# Patient Record
Sex: Female | Born: 1945 | Race: Black or African American | Hispanic: No | Smoking: Current every day smoker
Health system: Southern US, Community
[De-identification: ages and names within clinical notes are randomized; demographics above are authoritative.]

## PROBLEM LIST (undated history)

## (undated) DIAGNOSIS — E785 Hyperlipidemia, unspecified: Secondary | ICD-10-CM

## (undated) DIAGNOSIS — I10 Essential (primary) hypertension: Secondary | ICD-10-CM

## (undated) HISTORY — DX: Hyperlipidemia, unspecified: E78.5

## (undated) HISTORY — PX: BREAST SURGERY: SHX581

---

## 1997-12-26 ENCOUNTER — Emergency Department (HOSPITAL_COMMUNITY): Admission: EM | Admit: 1997-12-26 | Discharge: 1997-12-26 | Payer: Self-pay | Admitting: Internal Medicine

## 1999-08-15 ENCOUNTER — Inpatient Hospital Stay (HOSPITAL_COMMUNITY): Admission: EM | Admit: 1999-08-15 | Discharge: 1999-08-16 | Payer: Self-pay | Admitting: Emergency Medicine

## 1999-08-17 ENCOUNTER — Encounter: Payer: Self-pay | Admitting: *Deleted

## 1999-08-17 ENCOUNTER — Emergency Department (HOSPITAL_COMMUNITY): Admission: EM | Admit: 1999-08-17 | Discharge: 1999-08-17 | Payer: Self-pay | Admitting: *Deleted

## 2003-03-15 ENCOUNTER — Emergency Department (HOSPITAL_COMMUNITY): Admission: EM | Admit: 2003-03-15 | Discharge: 2003-03-15 | Payer: Self-pay | Admitting: Emergency Medicine

## 2003-11-27 ENCOUNTER — Emergency Department (HOSPITAL_COMMUNITY): Admission: EM | Admit: 2003-11-27 | Discharge: 2003-11-27 | Payer: Self-pay | Admitting: Emergency Medicine

## 2003-11-28 ENCOUNTER — Emergency Department (HOSPITAL_COMMUNITY): Admission: EM | Admit: 2003-11-28 | Discharge: 2003-11-28 | Payer: Self-pay | Admitting: Family Medicine

## 2003-12-04 ENCOUNTER — Encounter: Admission: RE | Admit: 2003-12-04 | Discharge: 2003-12-04 | Payer: Self-pay | Admitting: Family Medicine

## 2003-12-10 ENCOUNTER — Emergency Department (HOSPITAL_COMMUNITY): Admission: EM | Admit: 2003-12-10 | Discharge: 2003-12-10 | Payer: Self-pay | Admitting: Emergency Medicine

## 2004-04-29 ENCOUNTER — Emergency Department (HOSPITAL_COMMUNITY): Admission: EM | Admit: 2004-04-29 | Discharge: 2004-04-29 | Payer: Self-pay | Admitting: *Deleted

## 2004-05-11 ENCOUNTER — Emergency Department (HOSPITAL_COMMUNITY): Admission: EM | Admit: 2004-05-11 | Discharge: 2004-05-11 | Payer: Self-pay | Admitting: *Deleted

## 2004-07-08 ENCOUNTER — Emergency Department (HOSPITAL_COMMUNITY): Admission: EM | Admit: 2004-07-08 | Discharge: 2004-07-08 | Payer: Self-pay | Admitting: Emergency Medicine

## 2005-07-26 ENCOUNTER — Emergency Department (HOSPITAL_COMMUNITY): Admission: EM | Admit: 2005-07-26 | Discharge: 2005-07-26 | Payer: Self-pay | Admitting: Emergency Medicine

## 2006-01-20 IMAGING — CR DG LUMBAR SPINE COMPLETE 4+V
5 series · 5 of 5 positions shown · non-contrast
Comparison: none

CLINICAL DATA: Low back pain. 
 COMPLETE LUMBAR SPINE, 12/10/03

[view not recorded (1 of 5)]
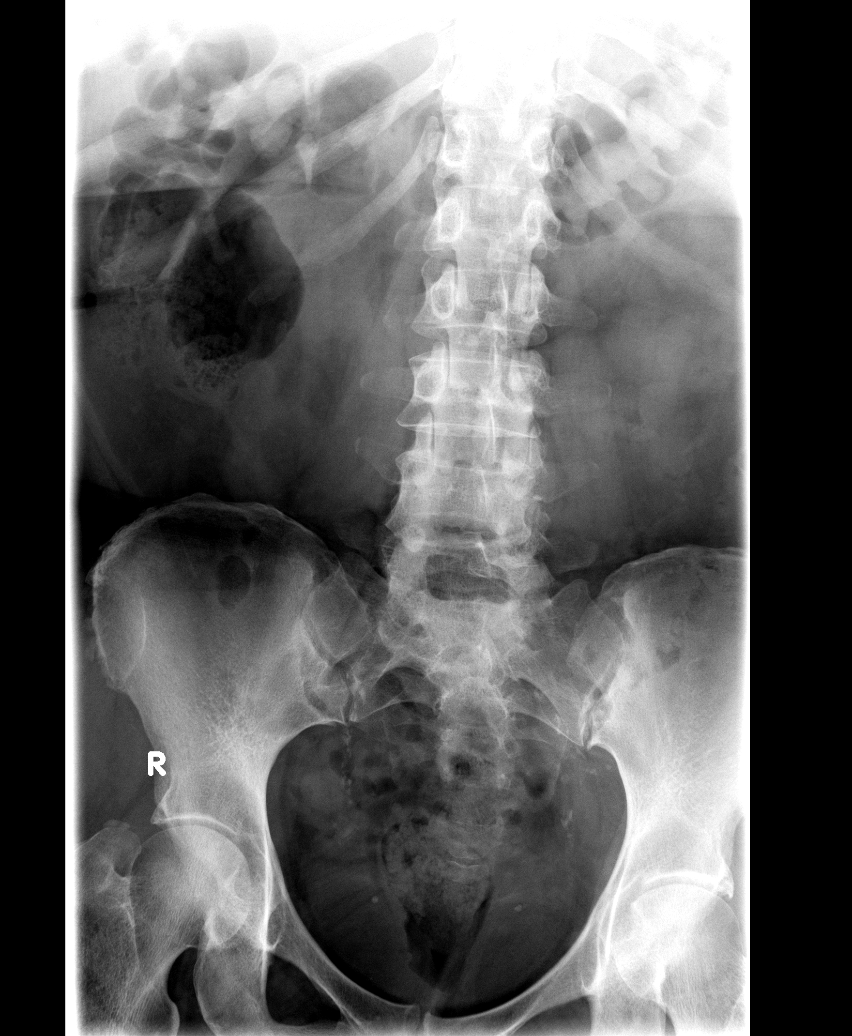

[view not recorded (2 of 5)]
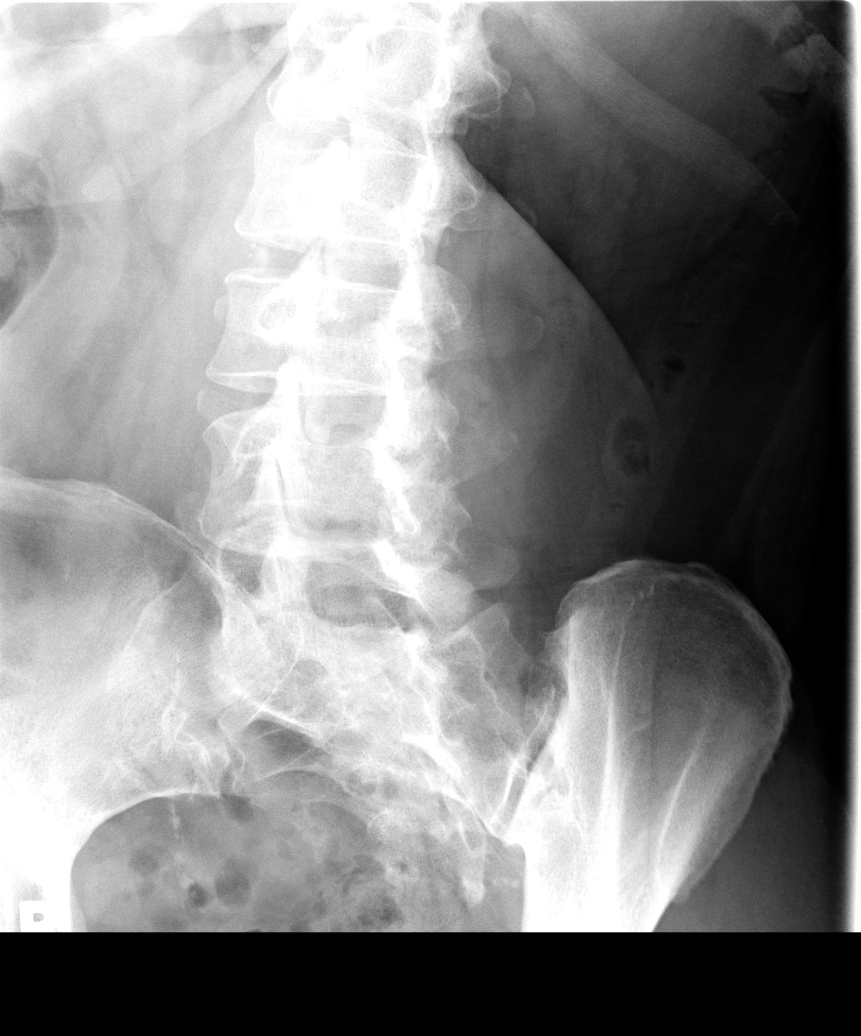

[view not recorded (3 of 5)]
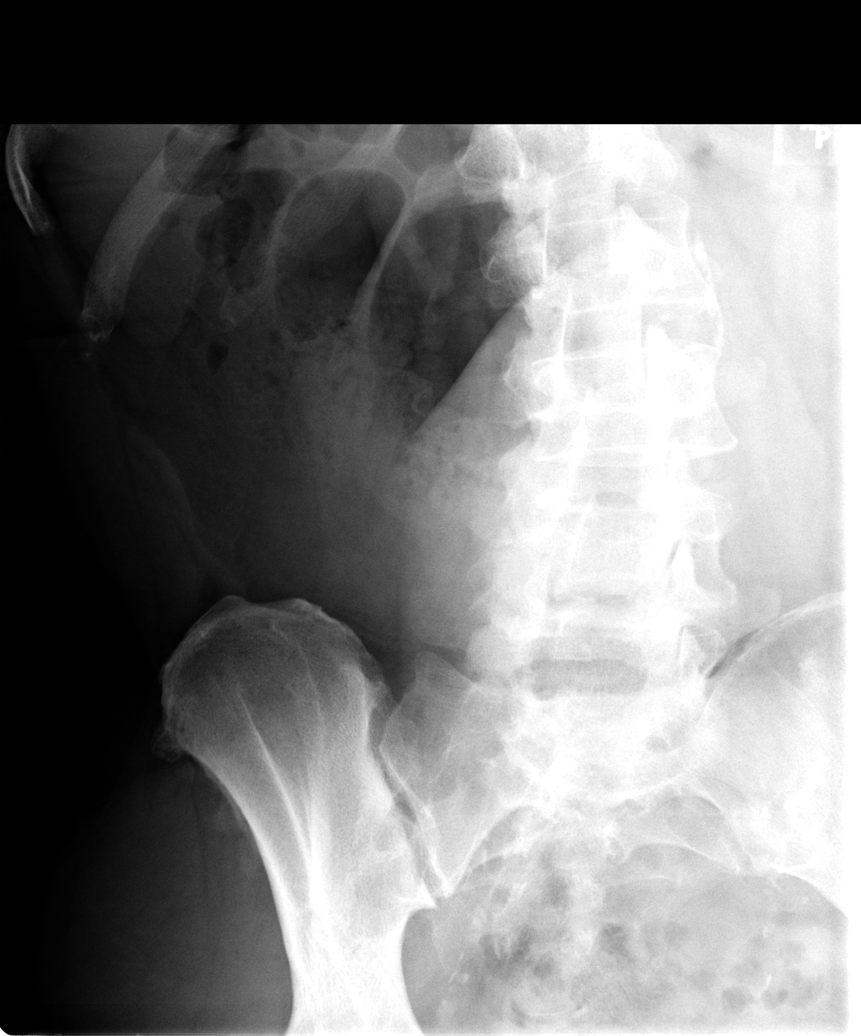

[view not recorded (4 of 5)]
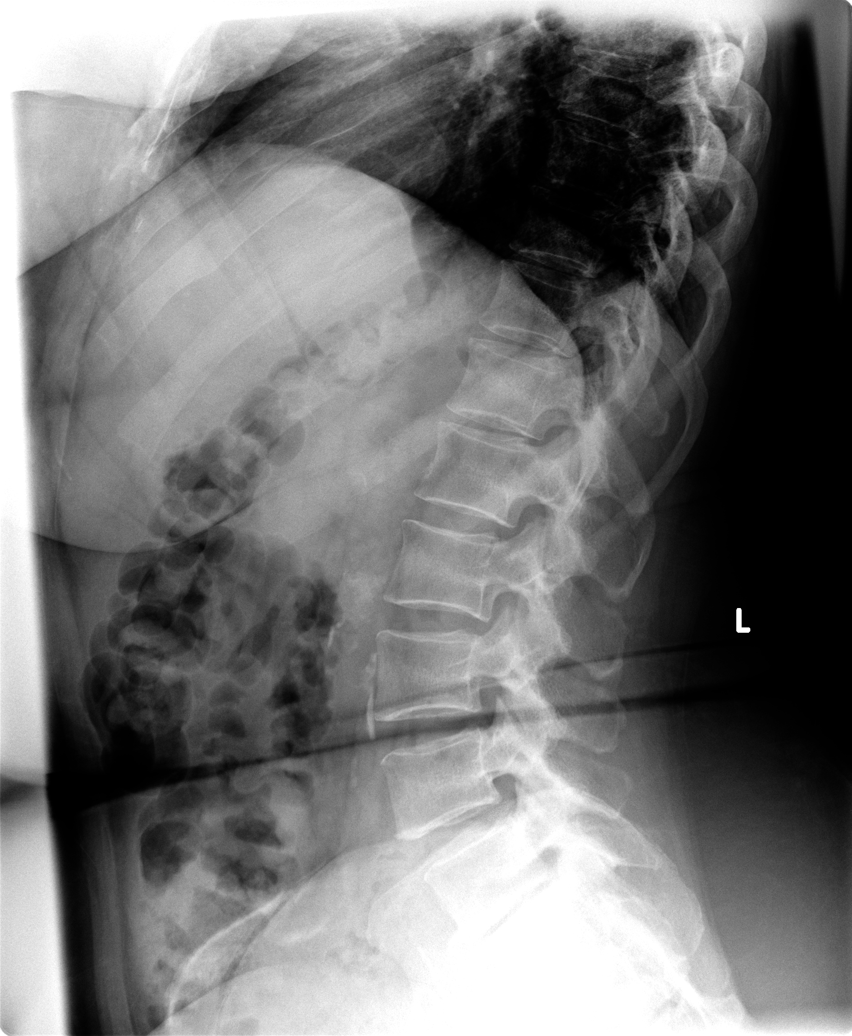

[view not recorded (5 of 5)]
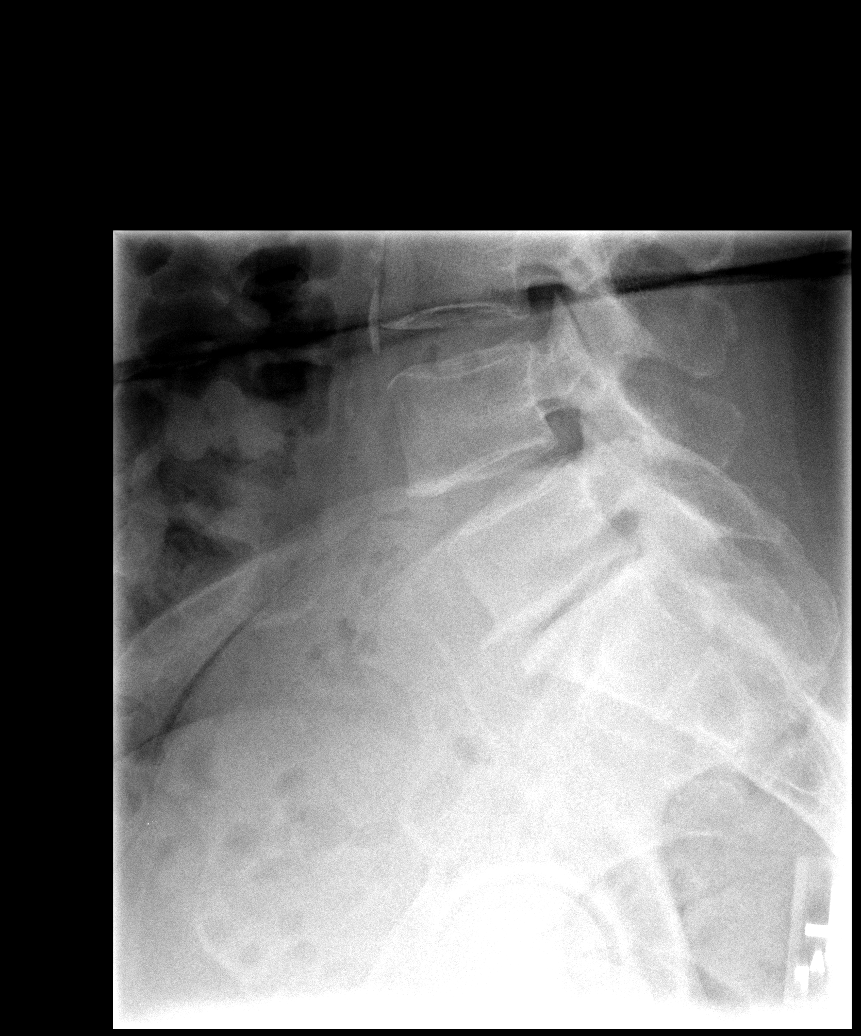

[5 of 5 positions shown; findings below may reference images not displayed]

FINDINGS: Five non-rib-bearing lumbar type vertebrae demonstrate normal alignment.   There is no evidence of fracture, subluxation or dislocation.  Mild degenerative disc disease throughout the lumbar spine noted with moderate degenerative disc disease and spondylosis L5-S1.  Mild facet arthropathy in the lower lumbar spine also noted.  
 IMPRESSION
 No acute abnormality.  
 Degenerative changes as described, moderate at L5-S1.

## 2006-01-20 IMAGING — CR DG CERVICAL SPINE COMPLETE 4+V
5 series · 5 of 5 positions shown · non-contrast
Comparison: none

CLINICAL DATA: 57-year-old with left arm numbness and weakness.  Neck pain.
 CERVICAL SPINE COMPLETE:
 No prior exams are available for comparison.
 AP, lateral, odontoid and oblique views are performed of the cervical spine showing no evidence for acute fracture or dislocation.  No significant foraminal narrowing or degenerative change is identified.  
 IMPRESSION
 No evidence for acute abnormality of the cervical spine.

[view not recorded (1 of 5)]
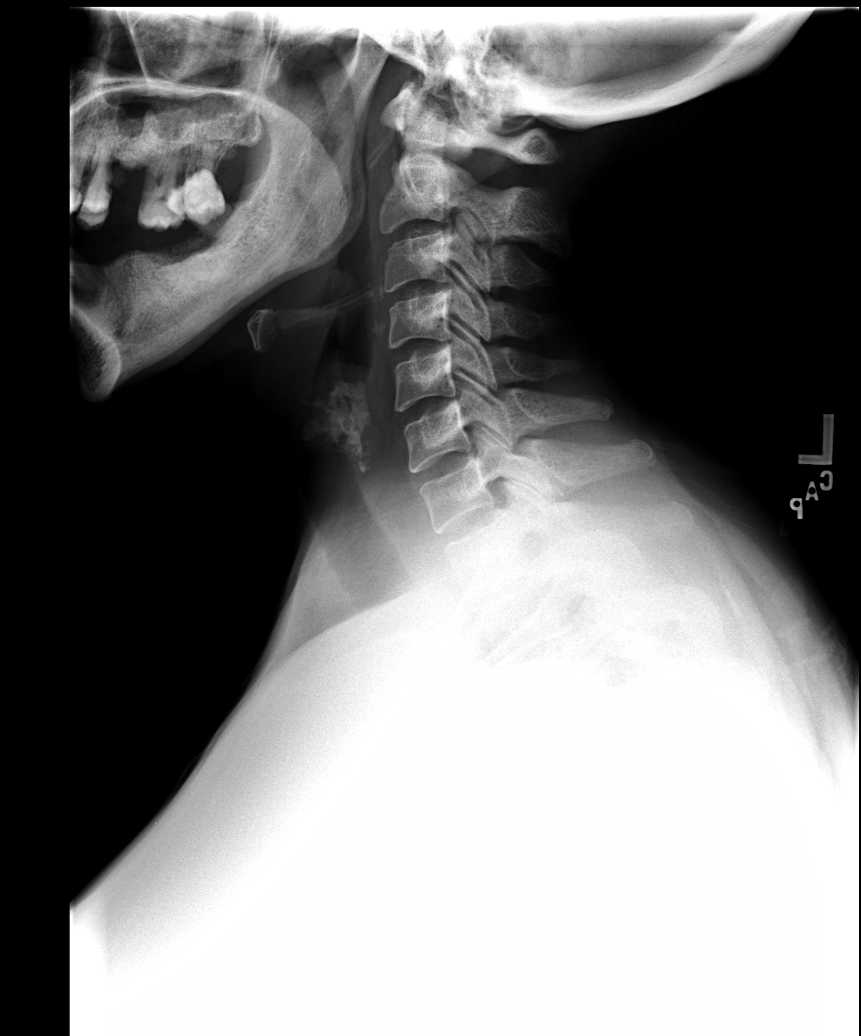

[view not recorded (2 of 5)]
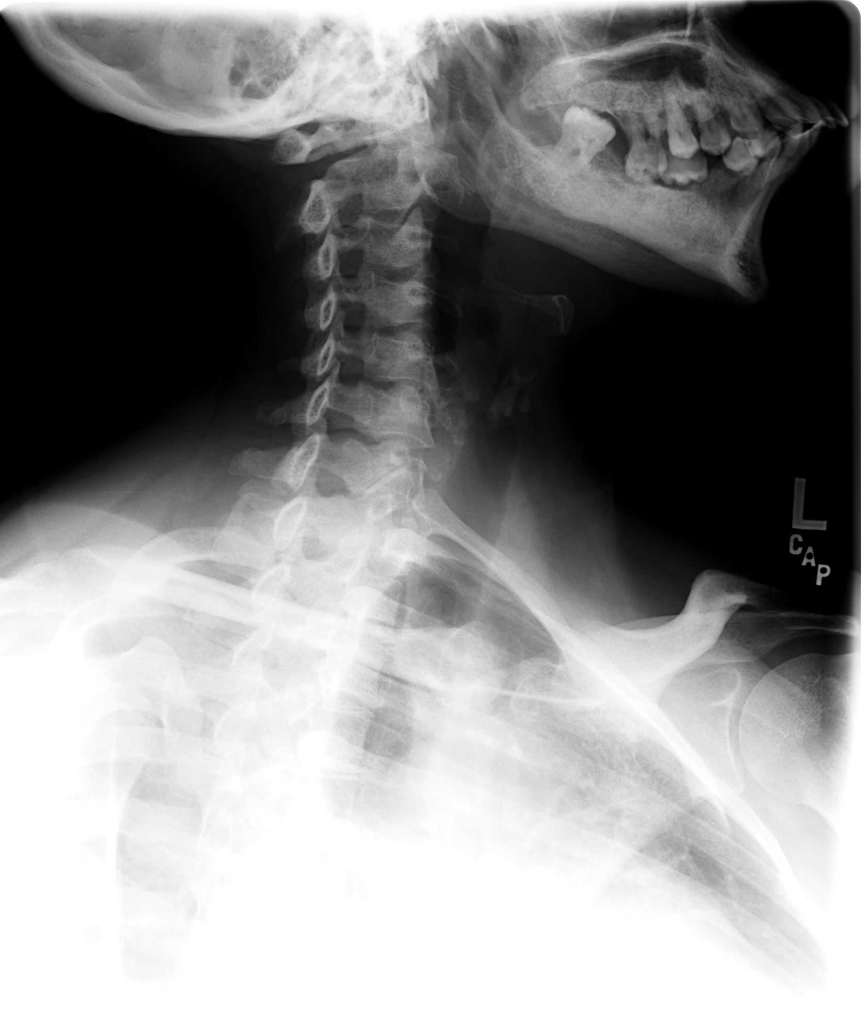

[view not recorded (3 of 5)]
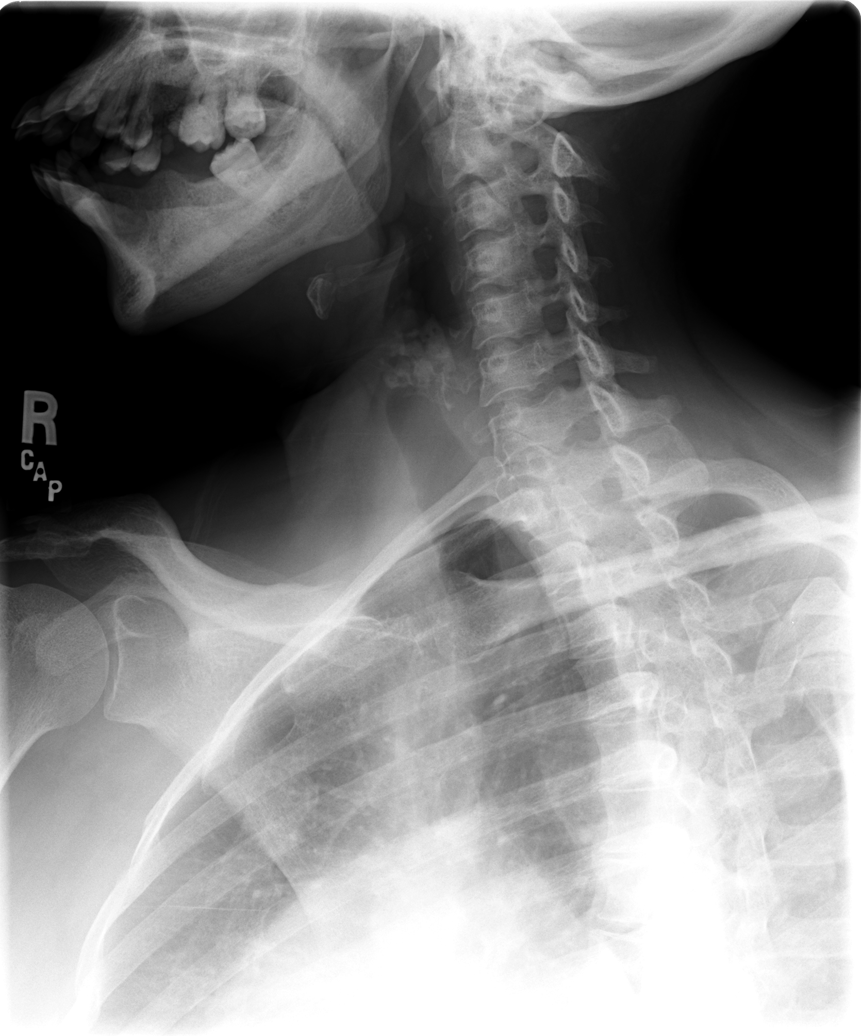

[view not recorded (4 of 5)]
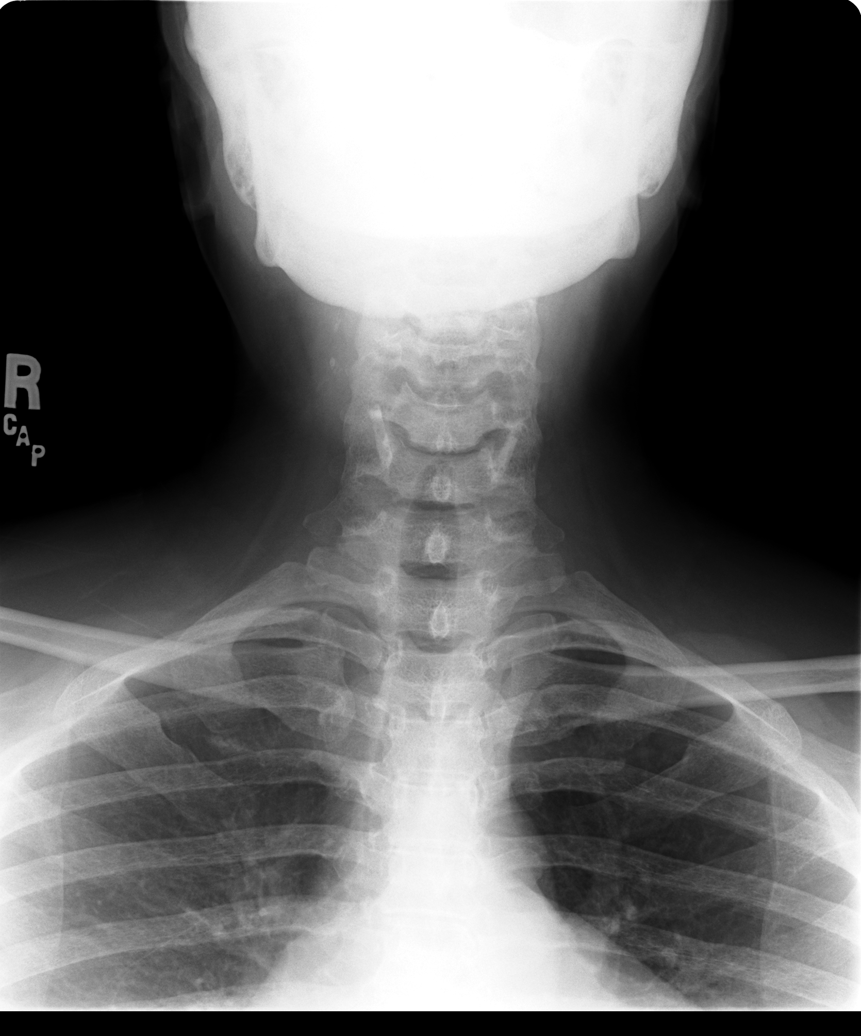

[view not recorded (5 of 5)]
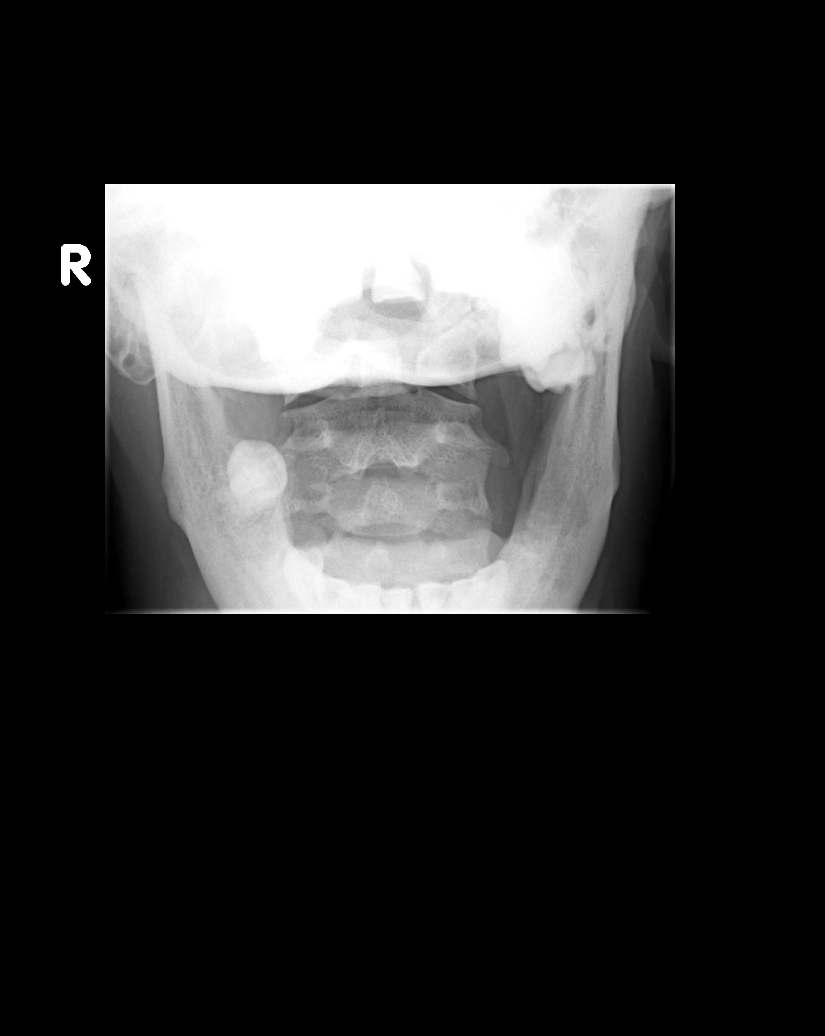

[5 of 5 positions shown; findings below may reference images not displayed]

## 2006-02-16 ENCOUNTER — Emergency Department (HOSPITAL_COMMUNITY): Admission: EM | Admit: 2006-02-16 | Discharge: 2006-02-16 | Payer: Self-pay | Admitting: Emergency Medicine

## 2009-01-16 ENCOUNTER — Emergency Department (HOSPITAL_COMMUNITY): Admission: EM | Admit: 2009-01-16 | Discharge: 2009-01-16 | Payer: Self-pay | Admitting: Emergency Medicine

## 2009-01-22 ENCOUNTER — Encounter: Admission: RE | Admit: 2009-01-22 | Discharge: 2009-02-06 | Payer: Self-pay | Admitting: Family Medicine

## 2010-08-23 NOTE — Discharge Summary (Signed)
[place]  Patient:    Andrea Meyer, Andrea Meyer                      MRN: [id number] Adm. Date:  [id number] Disc. Date: [id number] Attending:  Carmelina Peal Dictator:   Tawni Millers CC:         Charlynne Pander. Bruna Potter, M.D.                           Discharge Summary  DATE OF BIRTH:  [birth year]  DISCHARGE DIAGNOSES: 1. Anaphylactic reaction causing cardiopulmonary arrest. 2. History of "hyperventilation syndrome" with heart palpitations, especially    with consumption of caffeine. 3. Hypertension, treated x 27 years. 4. History of shoulder bursitis.  DISCHARGE MEDICATIONS: 1. Calan 180 mg p.o. q.d. 2. Maxzide 25 mg p.o. q.d. 3. The patient was discharged with an EpiPen.  HISTORY OF PRESENT ILLNESS:  This 65 year old black female who was out fishing was stung by a bee, became short of breath and dizzy, then went down.  Upon EMS arrival she was pulseless and apneic.  CPR was performed x 10 minutes, and an epinephrine injection was given.  She awoke in the hospital.  PAST MEDICAL HISTORY: 1. History of "bee sting allergy."  The patient says she gets local swelling    from bee stings.  She has no history of this anaphylactic reaction. 2. Hypertension for 27 years. 3. Occasional heart fluttering.  Cardiac workup by Dr. Clarene Duke was negative,    unknown time on this workup.  PHYSICAL EXAMINATION:  When the patient was first found her systolic blood pressure was in the 80s, diastolic in the 40s.  On arrival to the ED, her blood pressure was 90-110/40-60 with a pulse in the 90s, respirations 18, temperature 96.2.  The only significant exam finding was that she is tender throughout her chest secondary to chest compressions.  Her tongue was not swollen, nor was there any local edema or sign of bee sting on her arms. Initial CK and troponin were negative.  HOSPITAL COURSE: #1 - ANAPHYLACTIC REACTION WITH CARDIOPULMONARY COLLAPSE:  The patient remained  stable in the hospital with no further episodes.  She did have nothing except a few palpitations, which the patient gets at home as well. Blood pressure remained in the low 100s-110s, and there was no shortness of breath.  After she was observed for almost 24 hours, she was sent home with an epinephrine pen and continued on her regular medicine.  She is to follow up with me to see how she is doing posthospitalization.  #2 - HYPERTENSION:  Have continued her Calan and Maxzide.  This will be followed up.  #3 - MILD HYPERGLYCEMIA ON ADMISSION:  The patients glucose was 167 and 190 on blood test.  This may be due to the steroids she got in the emergency room. This needs to be followed up as an outpatient.  #4 - OTHER STUDIES:  The patient had a chest x-ray which was normal.  She also had cardiac enzymes which were 362 and 5.5 CK-MB and 426 and 6.1.  This is status post cardiac compressions and is to be expected.  White cell count of 6.1, hemoglobin 15, platelets 285, with MCV of 90.7.  She did have a potassium of 2.5 which came up to 3.9 after 40 mEq p.o. when it was checked the next day.  She also had a total bilirubin  of 0.3, alkaline phosphatase 56, AST of 70, ALT of 62, total protein 6.2, albumin 3.0, and calcium 8.3.  She had a UA which was cloudy, with 30 protein and large white blood cells, but had many cl and 21-50 white blood cells.  FOLLOW-UP:  The patient was instructed to follow up with Dr. Tawni Millers in the hospital clinic on Aug 25, 2064. DD:  08/16/99 TD:  08/19/99 Job: [id number]

## 2012-06-02 ENCOUNTER — Emergency Department (HOSPITAL_COMMUNITY)
Admission: EM | Admit: 2012-06-02 | Discharge: 2012-06-02 | Disposition: A | Discharge status: Home / Self Care | Payer: Medicare Other | Attending: Emergency Medicine | Admitting: Emergency Medicine

## 2012-06-02 ENCOUNTER — Encounter (HOSPITAL_COMMUNITY): Payer: Self-pay | Admitting: *Deleted

## 2012-06-02 ENCOUNTER — Emergency Department (HOSPITAL_COMMUNITY): Payer: Medicare Other

## 2012-06-02 DIAGNOSIS — I10 Essential (primary) hypertension: Secondary | ICD-10-CM | POA: Insufficient documentation

## 2012-06-02 DIAGNOSIS — Z760 Encounter for issue of repeat prescription: Secondary | ICD-10-CM | POA: Insufficient documentation

## 2012-06-02 DIAGNOSIS — R42 Dizziness and giddiness: Secondary | ICD-10-CM | POA: Insufficient documentation

## 2012-06-02 DIAGNOSIS — R531 Weakness: Secondary | ICD-10-CM

## 2012-06-02 DIAGNOSIS — J40 Bronchitis, not specified as acute or chronic: Secondary | ICD-10-CM | POA: Insufficient documentation

## 2012-06-02 DIAGNOSIS — F17200 Nicotine dependence, unspecified, uncomplicated: Secondary | ICD-10-CM | POA: Insufficient documentation

## 2012-06-02 DIAGNOSIS — E785 Hyperlipidemia, unspecified: Secondary | ICD-10-CM | POA: Insufficient documentation

## 2012-06-02 DIAGNOSIS — E669 Obesity, unspecified: Secondary | ICD-10-CM | POA: Insufficient documentation

## 2012-06-02 DIAGNOSIS — R5381 Other malaise: Secondary | ICD-10-CM | POA: Insufficient documentation

## 2012-06-02 DIAGNOSIS — Z79899 Other long term (current) drug therapy: Secondary | ICD-10-CM | POA: Insufficient documentation

## 2012-06-02 HISTORY — DX: Essential (primary) hypertension: I10

## 2012-06-02 LAB — COMPREHENSIVE METABOLIC PANEL
ALT: 11 U/L (ref 0–35)
AST: 17 U/L (ref 0–37)
Albumin: 3.8 g/dL (ref 3.5–5.2)
Alkaline Phosphatase: 91 U/L (ref 39–117)
BUN: 15 mg/dL (ref 6–23)
CO2: 25 mEq/L (ref 19–32)
Calcium: 10.2 mg/dL (ref 8.4–10.5)
Chloride: 99 mEq/L (ref 96–112)
Creatinine, Ser: 0.91 mg/dL (ref 0.50–1.10)
GFR calc Af Amer: 74 mL/min — ABNORMAL LOW (ref >90)
GFR calc non Af Amer: 64 mL/min — ABNORMAL LOW (ref >90)
Glucose, Bld: 125 mg/dL — ABNORMAL HIGH (ref 70–99)
Potassium: 3.2 mEq/L — ABNORMAL LOW (ref 3.5–5.1)
Sodium: 137 mEq/L (ref 135–145)
Total Bilirubin: 0.2 mg/dL — ABNORMAL LOW (ref 0.3–1.2)
Total Protein: 8.6 g/dL — ABNORMAL HIGH (ref 6.0–8.3)

## 2012-06-02 LAB — LIPASE, BLOOD: Lipase: 46 U/L (ref 11–59)

## 2012-06-02 LAB — CBC
HCT: 41.3 % (ref 36.0–46.0)
Hemoglobin: 13.8 g/dL (ref 12.0–15.0)
MCH: 28.3 pg (ref 26.0–34.0)
MCHC: 33.4 g/dL (ref 30.0–36.0)
MCV: 84.6 fL (ref 78.0–100.0)
Platelets: 240 10*3/uL (ref 150–400)
RBC: 4.88 MIL/uL (ref 3.87–5.11)
RDW: 15.7 % — ABNORMAL HIGH (ref 11.5–15.5)
WBC: 8.2 10*3/uL (ref 4.0–10.5)

## 2012-06-02 LAB — POCT I-STAT TROPONIN I: Troponin i, poc: 0 ng/mL (ref 0.00–0.08)

## 2012-06-02 MED ORDER — POTASSIUM CHLORIDE CRYS ER 20 MEQ PO TBCR
40.0000 meq | EXTENDED_RELEASE_TABLET | Freq: Once | ORAL | Status: AC
  Administered 2012-06-02: 40 meq via ORAL
  Filled 2012-06-02: qty 2

## 2012-06-02 MED ORDER — ATENOLOL 100 MG PO TABS: 100.0000 mg | ORAL_TABLET | Freq: Every day | ORAL | Status: DC

## 2012-06-02 MED ORDER — SODIUM CHLORIDE 0.9 % IV BOLUS (SEPSIS)
500.0000 mL | Freq: Once | INTRAVENOUS | Status: AC
  Administered 2012-06-02: 500 mL via INTRAVENOUS

## 2012-06-02 MED ORDER — AMOXICILLIN-POT CLAVULANATE 875-125 MG PO TABS: 1.0000 | ORAL_TABLET | Freq: Two times a day (BID) | ORAL | Status: DC

## 2012-06-02 NOTE — ED Notes (Addendum)
Pt with burning to central chest and "racing heart" since this am.  Denies cardiac hx, but states there "was a time when she had to take nitro pills under my tongue.  Pt states "fluttering" stops when she is still.

## 2012-06-02 NOTE — ED Provider Notes (Signed)
History     CSN: [id number]  Arrival date & time 06/02/12  1519   First MD Initiated Contact with Patient 06/02/12 2014      Chief Complaint  Patient presents with  . Chest Pain  . Palpitations    (Consider location/radiation/quality/duration/timing/severity/associated sxs/prior treatment) HPI Comments: Norie A Patchen is a 67 y.o. female with a history of hypertension and hyperlipidemia presents emergency department with multiple complaints.  Patient reports that she had an onset of cough that began 3 days ago that is productive in nature but denies any shortness of breath, chest pain, fevers, night sweats or chills associated with it.  In addition yesterday the patient woke up feeling lightheaded, dizzy, and weak.  She reports that she went to shower and was so exhausted she had to finish early and was unable to attend work.  Today he went patient awoke dizziness and generalized weakness had resolved and patient went to work as per usual.  While at work patient drank a diet soda which "typically gives me a racing heart" which she developed after drinking the soda.  Patient states the itching since of heart fluttering has also resolved but decided to come to the emergency department to be checked out.  In addition patient reports that she is out of her atenolol and requests a prescription refill.  Patient denies any leg pain, leg swelling, change in vision, ataxia, disequilibrium, unilateral weakness, LOC, facial droop, slurred speech, diaphoresis, nausea, CP.   The history is provided by the patient.    Past Medical History  Diagnosis Date  . Hypertension     Past Surgical History  Procedure Laterality Date  . Breast surgery      tumor removal    No family history on file.  History  Substance Use Topics  . Smoking status: Current Every Day Smoker -- 0.25 packs/day    Types: Cigarettes  . Smokeless tobacco: Not on file  . Alcohol Use: No    OB History   Grav Para Term  Preterm Abortions TAB SAB Ect Mult Living                  Review of Systems  All other systems reviewed and are negative.    Allergies  Review of patient's allergies indicates no known allergies.  Home Medications   Current Outpatient Rx  Name  Route  Sig  Dispense  Refill  . atenolol (TENORMIN) 100 MG tablet   Oral   Take 100 mg by mouth daily.         . hydrochlorothiazide (HYDRODIURIL) 25 MG tablet   Oral   Take 25 mg by mouth daily.         . potassium chloride SA (K-DUR,KLOR-CON) 20 MEQ tablet   Oral   Take 20 mEq by mouth daily.         . pravastatin (PRAVACHOL) 20 MG tablet   Oral   Take 20 mg by mouth daily.           BP 163/70  Pulse 112  Temp(Src) 97.9 F (36.6 C) (Oral)  Resp 18  Ht 5\' 6"  (1.676 m)  Wt 208 lb (94.348 kg)  BMI 33.59 kg/m2  SpO2 97%  Physical Exam  Nursing note and vitals reviewed. Constitutional: She is oriented to person, place, and time. She appears well-developed and well-nourished. No distress.  Patient very pleasant & not in visible distress  HENT:  Head: Normocephalic and atraumatic.  Eyes: Conjunctivae and EOM are  normal. Pupils are equal, round, and reactive to light.  Neck: Normal range of motion. Neck supple. Normal carotid pulses, no hepatojugular reflux and no JVD present. Carotid bruit is not present.  No carotid bruits, no JVD  Cardiovascular:  RRR, no aberrancy on auscultation, intact distal pulses no pitting edema bilaterally.  Pulmonary/Chest: Effort normal and breath sounds normal. No respiratory distress.  Lungs clear to auscultation bilaterally  Abdominal: Soft. She exhibits no distension. There is no tenderness.  Obese abdomen, normal bowel sounds. Nonpulsatile aorta  Musculoskeletal: Normal range of motion. She exhibits no tenderness.  Neurological: She is alert and oriented to person, place, and time. She displays a negative Romberg sign.  Cranial nerves III through XII intact, normal  coordination, negative Romberg, strength 5/5 bilaterally. No ataxia  Skin: Skin is warm and dry. No pallor.  Psychiatric: She has a normal mood and affect. Her behavior is normal.    ED Course  Procedures (including critical care time)  Labs Reviewed  COMPREHENSIVE METABOLIC PANEL - Abnormal; Notable for the following:    Potassium 3.2 (*)    Glucose, Bld 125 (*)    Total Protein 8.6 (*)    Total Bilirubin 0.2 (*)    GFR calc non Af Amer 64 (*)    GFR calc Af Amer 74 (*)    All other components within normal limits  CBC - Abnormal; Notable for the following:    RDW 15.7 (*)    All other components within normal limits  LIPASE, BLOOD  POCT I-STAT TROPONIN I  POCT I-STAT TROPONIN I   Dg Chest 2 View  06/02/2012  *RADIOLOGY REPORT*  Clinical Data: Chest pain, palpitations  CHEST - 2 VIEW  Comparison: Chest x-ray of 01/16/2009  Findings: No active infiltrate or effusion is seen.  Mild peribronchial thickening is noted which may indicate bronchitis. Mediastinal contours are normal.  The heart is within upper limits of normal.  No acute skeletal abnormality is seen.  IMPRESSION: No pneumonia.  Peribronchial thickening may indicate bronchitis.   Original Report Authenticated By: Dwyane Dee, M.D.     Date: 06/02/2012  Rate: 107  Rhythm: sinus tachycardia  QRS Axis: normal  Intervals: normal  ST/T Wave abnormalities: normal  Conduction Disutrbances:none  Narrative Interpretation:   Old EKG Reviewed: unchanged    No diagnosis found.  No orthostatic hypotension. Pt ambulates through out ED without difficulty BP 139/88  Pulse 87  Temp(Src) 97.9 F (36.6 C) (Oral)  Resp 13  Ht 5\' 6"  (1.676 m)  Wt 208 lb (94.348 kg)  BMI 33.59 kg/m2  SpO2 96%    MDM  Patient is a 67 year old female with a history of hypertension presents emergency department with multiple complaints including productive cough x3 days, medication refill for atenolol, and intermittent palpitations and  lightheadedness that have since resolved.  Patient states that she's never been evaluated by a cardiologist which will be referred to her at discharge.  Throughout hospital stay ppatient has been asymptomatic, in no acute distress with normal vital signs.  Chest x-ray with peribronchial thickening and likely diagnosis of bronchitis.  Discussed the antibiotics are not indicated  However pt states that she has had increased sputum production and requests Rx. Pt also denies any CP and has had 2 neg troponin's, and a mild tachycardia seen on ECG which was otherwise normal.   Pt has been advised start a PPI and return to the ED if worsening s/s develop including CP that becomes exertional, associated with diaphoresis  or nausea, radiates to left jaw/arm, worsens or becomes concerning in any way. Pt appears reliable for follow up and is agreeable to discharge.   Case has been discussed with and seen by Dr. Juleen China who agrees with the above plan to discharge.          Jaci Carrel, [place] 06/02/12 2307

## 2012-06-03 NOTE — ED Provider Notes (Signed)
Medical screening examination/treatment/procedure(s) were conducted as a shared visit with non-physician practitioner(s) and myself.  I personally evaluated the patient during the encounter  66yf with palpitations. Denies CP or SOB to me. On exam NAD. Well appearing. Lungs clear. No increased WOB. Heart reg and I could not appreciate a murmur. Abdomen benign. Skin warm and dry. Lower extremities symmetric as compared to each other. No calf tenderness. Negative Homan's. No palpable cords. W/u fairly unremarkable. Low suspicion for emergent etiology. Outpt FU.   Raeford Razor, MD 06/03/12 [phone]

## 2012-06-08 ENCOUNTER — Other Ambulatory Visit: Payer: Self-pay | Admitting: Family Medicine

## 2012-06-08 DIAGNOSIS — Z1231 Encounter for screening mammogram for malignant neoplasm of breast: Secondary | ICD-10-CM

## 2012-06-25 ENCOUNTER — Institutional Professional Consult (permissible substitution): Payer: Medicare Other | Admitting: Internal Medicine

## 2012-07-02 ENCOUNTER — Institutional Professional Consult (permissible substitution): Payer: Medicare Other | Admitting: Cardiovascular Disease

## 2012-07-06 ENCOUNTER — Encounter: Payer: Self-pay | Admitting: *Deleted

## 2012-07-08 ENCOUNTER — Institutional Professional Consult (permissible substitution): Payer: Medicare Other | Admitting: Cardiovascular Disease

## 2012-07-12 ENCOUNTER — Ambulatory Visit: Payer: Medicare Other

## 2012-07-29 ENCOUNTER — Encounter: Payer: Medicare Other | Admitting: Gastroenterology

## 2014-04-10 DIAGNOSIS — J42 Unspecified chronic bronchitis: Secondary | ICD-10-CM | POA: Diagnosis not present

## 2014-07-14 IMAGING — CR DG CHEST 2V
2 series · 2 of 2 positions shown · non-contrast
Comparison: Chest x-ray of 01/16/2009

CLINICAL DATA: Chest pain, palpitations

CHEST - 2 VIEW

[w chest pa]
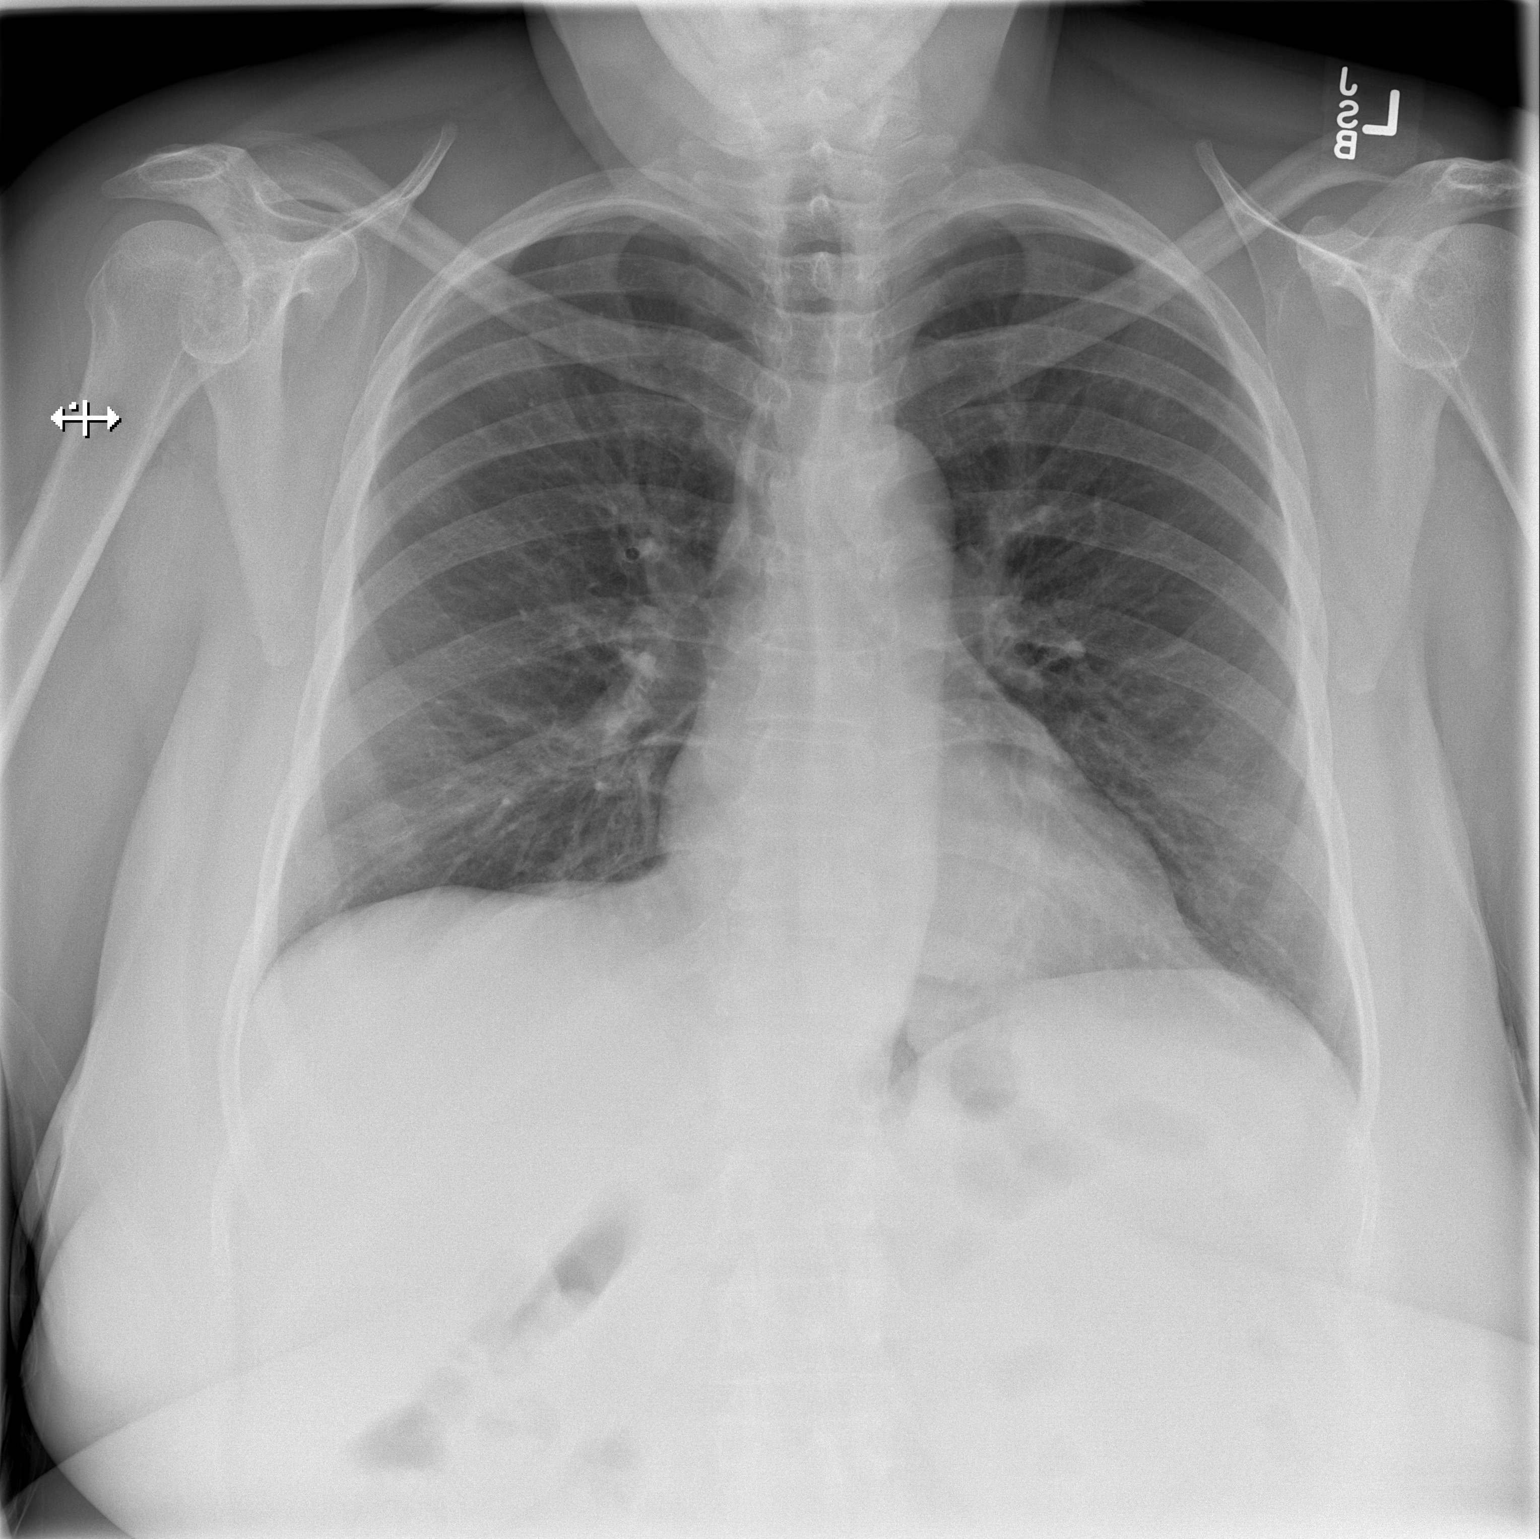

[w chest lat]
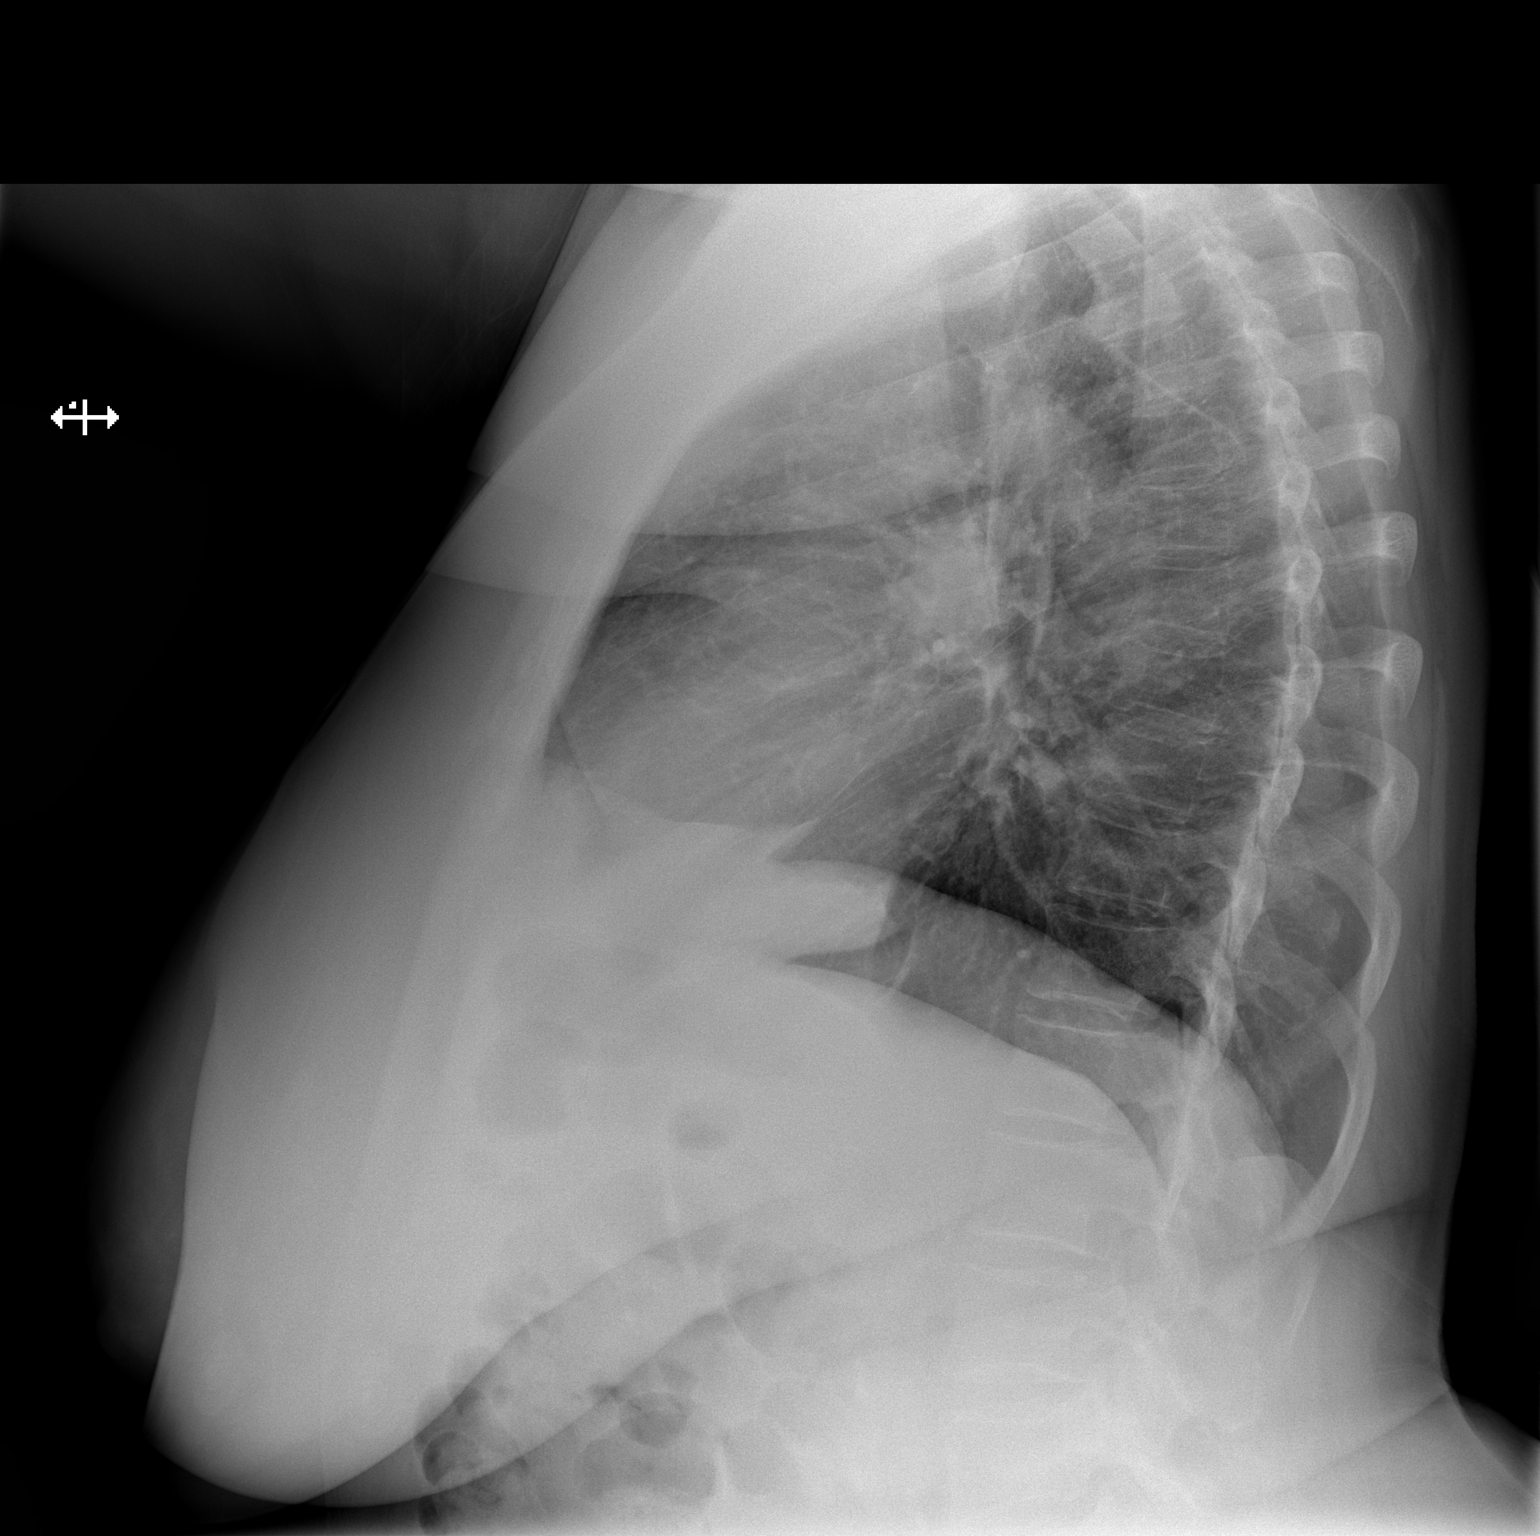

[2 of 2 positions shown; findings below may reference images not displayed]

FINDINGS: No active infiltrate or effusion is seen.  Mild
peribronchial thickening is noted which may indicate bronchitis.
Mediastinal contours are normal.  The heart is within upper limits
of normal.  No acute skeletal abnormality is seen.
IMPRESSION: No pneumonia.  Peribronchial thickening may indicate bronchitis.

## 2014-07-20 DIAGNOSIS — I10 Essential (primary) hypertension: Secondary | ICD-10-CM | POA: Diagnosis not present

## 2014-10-16 DIAGNOSIS — E785 Hyperlipidemia, unspecified: Secondary | ICD-10-CM | POA: Diagnosis not present

## 2014-12-12 DIAGNOSIS — J40 Bronchitis, not specified as acute or chronic: Secondary | ICD-10-CM | POA: Diagnosis not present

## 2015-05-16 DIAGNOSIS — M19012 Primary osteoarthritis, left shoulder: Secondary | ICD-10-CM | POA: Diagnosis not present

## 2015-08-22 DIAGNOSIS — H04123 Dry eye syndrome of bilateral lacrimal glands: Secondary | ICD-10-CM | POA: Diagnosis not present

## 2015-08-22 DIAGNOSIS — H2513 Age-related nuclear cataract, bilateral: Secondary | ICD-10-CM | POA: Diagnosis not present

## 2015-08-22 DIAGNOSIS — H40013 Open angle with borderline findings, low risk, bilateral: Secondary | ICD-10-CM | POA: Diagnosis not present

## 2015-09-10 DIAGNOSIS — H2513 Age-related nuclear cataract, bilateral: Secondary | ICD-10-CM | POA: Diagnosis not present

## 2015-09-10 DIAGNOSIS — H40013 Open angle with borderline findings, low risk, bilateral: Secondary | ICD-10-CM | POA: Diagnosis not present

## 2015-10-01 DIAGNOSIS — M159 Polyosteoarthritis, unspecified: Secondary | ICD-10-CM | POA: Diagnosis not present

## 2015-10-01 DIAGNOSIS — I119 Hypertensive heart disease without heart failure: Secondary | ICD-10-CM | POA: Diagnosis not present

## 2015-10-01 DIAGNOSIS — E7800 Pure hypercholesterolemia, unspecified: Secondary | ICD-10-CM | POA: Diagnosis not present

## 2015-10-01 DIAGNOSIS — Z79899 Other long term (current) drug therapy: Secondary | ICD-10-CM | POA: Diagnosis not present

## 2016-01-10 ENCOUNTER — Emergency Department (HOSPITAL_COMMUNITY)
Admission: EM | Admit: 2016-01-10 | Discharge: 2016-01-10 | Disposition: A | Discharge status: Home / Self Care | Payer: Medicare Other | Attending: Emergency Medicine | Admitting: Emergency Medicine

## 2016-01-10 ENCOUNTER — Encounter (HOSPITAL_COMMUNITY): Payer: Self-pay

## 2016-01-10 DIAGNOSIS — I10 Essential (primary) hypertension: Secondary | ICD-10-CM | POA: Diagnosis not present

## 2016-01-10 DIAGNOSIS — F17210 Nicotine dependence, cigarettes, uncomplicated: Secondary | ICD-10-CM | POA: Insufficient documentation

## 2016-01-10 DIAGNOSIS — R002 Palpitations: Secondary | ICD-10-CM | POA: Diagnosis not present

## 2016-01-10 LAB — BASIC METABOLIC PANEL
ANION GAP: 9 (ref 5–15)
BUN: 20 mg/dL (ref 6–20)
CHLORIDE: 101 mmol/L (ref 101–111)
CO2: 26 mmol/L (ref 22–32)
CREATININE: 1.12 mg/dL — AB (ref 0.44–1.00)
Calcium: 10.1 mg/dL (ref 8.9–10.3)
GFR calc non Af Amer: 49 mL/min — ABNORMAL LOW (ref >60)
GFR, EST AFRICAN AMERICAN: 56 mL/min — AB (ref >60)
Glucose, Bld: 103 mg/dL — ABNORMAL HIGH (ref 65–99)
Potassium: 4 mmol/L (ref 3.5–5.1)
SODIUM: 136 mmol/L (ref 135–145)

## 2016-01-10 LAB — CBC WITH DIFFERENTIAL/PLATELET
BASOS PCT: 0 %
Basophils Absolute: 0 10*3/uL (ref 0.0–0.1)
EOS ABS: 0.1 10*3/uL (ref 0.0–0.7)
Eosinophils Relative: 1 %
HEMATOCRIT: 43.4 % (ref 36.0–46.0)
HEMOGLOBIN: 13.7 g/dL (ref 12.0–15.0)
LYMPHS ABS: 3.3 10*3/uL (ref 0.7–4.0)
Lymphocytes Relative: 35 %
MCH: 27.7 pg (ref 26.0–34.0)
MCHC: 31.6 g/dL (ref 30.0–36.0)
MCV: 87.9 fL (ref 78.0–100.0)
MONOS PCT: 11 %
Monocytes Absolute: 1 10*3/uL (ref 0.1–1.0)
NEUTROS ABS: 5 10*3/uL (ref 1.7–7.7)
NEUTROS PCT: 53 %
Platelets: 246 10*3/uL (ref 150–400)
RBC: 4.94 MIL/uL (ref 3.87–5.11)
RDW: 15.5 % (ref 11.5–15.5)
WBC: 9.4 10*3/uL (ref 4.0–10.5)

## 2016-01-10 LAB — I-STAT TROPONIN, ED: TROPONIN I, POC: 0.02 ng/mL (ref 0.00–0.08)

## 2016-01-10 NOTE — ED Notes (Signed)
Pt is in stable condition upon d/c and is escorted from ED via wheelchair. 

## 2016-01-10 NOTE — ED Provider Notes (Signed)
[facility]-EMERGENCY DEPT Provider Note   CSN: [id number] Arrival date & time: 01/10/16  [id number]     History   Chief Complaint No chief complaint on file.   HPI Andrea Meyer is a 70 y.o. female.  HPI  70 year old female with a history of hypertension, hyperlipidemia, and hypokalemia presents with palpitations. She states that last night a little after 10 PM she had palpitations lasting about 50 minutes. She did not have any associated chest pain or shortness of breath. It seemed to wake her up out of a dream. She had similar palpitations 2 weeks ago immediately after being changed from atenolol to Lopressor by her PCP due to medication supply issues. She is concerned the medication is causing this. She states several years ago she had similar problems and was found to have hypokalemia. She took a potassium pill last night after waking up with the palpitations. Took her blood pressure medicines this morning. Around for the morning before going to work she felt an "uneasiness" in her chest. Didn't really feel like pain or pressure. She rubbed vapor rub on her chest and then it went away. There was no associated shortness of breath, dizziness, palpitations, nausea, vomiting. Currently she feels fine. Has not been ill recently.  Past Medical History:  Diagnosis Date  . Hyperlipidemia   . Hypertension     There are no active problems to display for this patient.   Past Surgical History:  Procedure Laterality Date  . BREAST SURGERY     tumor removal    OB History    No data available       Home Medications    Prior to Admission medications   Medication Sig Start Date End Date Taking? Authorizing Provider  amoxicillin-clavulanate (AUGMENTIN) 875-125 MG per tablet Take 1 tablet by mouth 2 (two) times daily. One po bid x 7 days 06/02/12   Lisette Paz, PA-C  atenolol (TENORMIN) 100 MG tablet Take 1 tablet (100 mg total) by mouth daily. 06/02/12   Lisette Paz, PA-C  hydrochlorothiazide  (HYDRODIURIL) 25 MG tablet Take 25 mg by mouth daily.    Historical Provider, MD  potassium chloride SA (K-DUR,KLOR-CON) 20 MEQ tablet Take 20 mEq by mouth daily.    Historical Provider, MD  pravastatin (PRAVACHOL) 20 MG tablet Take 20 mg by mouth daily.    Historical Provider, MD    Family History History reviewed. No pertinent family history.  Social History Social History  Substance Use Topics  . Smoking status: Current Every Day Smoker    Packs/day: 0.25    Types: Cigarettes  . Smokeless tobacco: Never Used  . Alcohol use No     Allergies   Review of patient's allergies indicates no known allergies.   Review of Systems Review of Systems  Constitutional: Negative for fever.  Respiratory: Negative for shortness of breath.   Cardiovascular: Positive for palpitations. Negative for chest pain.  Gastrointestinal: Negative for diarrhea and vomiting.  Neurological: Negative for dizziness.  All other systems reviewed and are negative.    Physical Exam Updated Vital Signs BP 152/69   Pulse 77   Temp 98 F (36.7 C) (Oral)   Resp 18   Ht 5\' 6"  (1.676 m)   Wt 220 lb (99.8 kg)   SpO2 99%   BMI 35.51 kg/m   Physical Exam  Constitutional: She is oriented to person, place, and time. She appears well-developed and well-nourished. No distress.  HENT:  Head: Normocephalic and atraumatic.  Right Ear: External  ear normal.  Left Ear: External ear normal.  Nose: Nose normal.  Eyes: Right eye exhibits no discharge. Left eye exhibits no discharge.  Cardiovascular: Normal rate, regular rhythm, normal heart sounds and intact distal pulses.   Pulmonary/Chest: Effort normal and breath sounds normal.  Abdominal: Soft. She exhibits no distension. There is no tenderness.  Neurological: She is alert and oriented to person, place, and time.  Skin: Skin is warm and dry. She is not diaphoretic.  Nursing note and vitals reviewed.    ED Treatments / Results  Labs (all labs ordered are  listed, but only abnormal results are displayed) Labs Reviewed  BASIC METABOLIC PANEL - Abnormal; Notable for the following:       Result Value   Glucose, Bld 103 (*)    Creatinine, Ser 1.12 (*)    GFR calc non Af Amer 49 (*)    GFR calc Af Amer 56 (*)    All other components within normal limits  CBC WITH DIFFERENTIAL/PLATELET  Rosezena Sensor, ED    EKG  EKG Interpretation  Date/Time:  Thursday January 10 2016 09:56:31 EDT Ventricular Rate:  86 PR Interval:  158 QRS Duration: 72 QT Interval:  362 QTC Calculation: 433 R Axis:   33 Text Interpretation:  Normal sinus rhythm Cannot rule out Anterior infarct , age undetermined Abnormal ECG tachycardia resolved compared to 2014 Confirmed by Keiosha Cancro MD, Tarra Pence [phone]) on 01/10/2016 10:07:32 AM       Radiology No results found.  Procedures Procedures (including critical care time)  Medications Ordered in ED Medications - No data to display   Initial Impression / Assessment and Plan / ED Course  I have reviewed the triage vital signs and the nursing notes.  Pertinent labs & imaging results that were available during my care of the patient were reviewed by me and considered in my medical decision making (see chart for details).  Clinical Course  Comment By Time  Will check CBC/electrolytes. Will do a troponin but her "episode" this morning would be highly atypical for ACS. Discussed f/u with PCP for holter monitoring. Pricilla Loveless, MD 10/05 1024  No palpitations while in the ED. No significant arrhythmias. Lab work is unremarkable. I have low suspicion for ACS and being this was solely palpitations. Call PCP for Holter monitoring and further workup and treatment of blood pressure. Pricilla Loveless, MD 10/05 1206     Final Clinical Impressions(s) / ED Diagnoses   Final diagnoses:  Palpitations    New Prescriptions New Prescriptions   No medications on file     Pricilla Loveless, MD 01/10/16 2005

## 2016-01-10 NOTE — ED Triage Notes (Signed)
Pt presents with 2 week h/o palpitations that are intermittent, reports changing her blood pressure medication.  Pt reports episode of tingling in L arm that resolved on it's own.

## 2016-01-10 NOTE — ED Notes (Signed)
Pt states she has been having palpitations since she was switched from atenolol to lopressor.

## 2016-01-10 NOTE — ED Notes (Signed)
Pt given OJ 

## 2016-03-17 DIAGNOSIS — Z79899 Other long term (current) drug therapy: Secondary | ICD-10-CM | POA: Diagnosis not present

## 2016-03-17 DIAGNOSIS — E7800 Pure hypercholesterolemia, unspecified: Secondary | ICD-10-CM | POA: Diagnosis not present

## 2016-03-17 DIAGNOSIS — M159 Polyosteoarthritis, unspecified: Secondary | ICD-10-CM | POA: Diagnosis not present

## 2016-03-17 DIAGNOSIS — I119 Hypertensive heart disease without heart failure: Secondary | ICD-10-CM | POA: Diagnosis not present

## 2016-03-17 DIAGNOSIS — N183 Chronic kidney disease, stage 3 (moderate): Secondary | ICD-10-CM | POA: Diagnosis not present

## 2016-06-30 DIAGNOSIS — N183 Chronic kidney disease, stage 3 (moderate): Secondary | ICD-10-CM | POA: Diagnosis not present

## 2016-06-30 DIAGNOSIS — M158 Other polyosteoarthritis: Secondary | ICD-10-CM | POA: Diagnosis not present

## 2016-06-30 DIAGNOSIS — I119 Hypertensive heart disease without heart failure: Secondary | ICD-10-CM | POA: Diagnosis not present

## 2016-06-30 DIAGNOSIS — J209 Acute bronchitis, unspecified: Secondary | ICD-10-CM | POA: Diagnosis not present

## 2016-06-30 DIAGNOSIS — Z79899 Other long term (current) drug therapy: Secondary | ICD-10-CM | POA: Diagnosis not present

## 2016-06-30 DIAGNOSIS — E7800 Pure hypercholesterolemia, unspecified: Secondary | ICD-10-CM | POA: Diagnosis not present

## 2016-09-15 DIAGNOSIS — H40013 Open angle with borderline findings, low risk, bilateral: Secondary | ICD-10-CM | POA: Diagnosis not present

## 2016-09-15 DIAGNOSIS — H2513 Age-related nuclear cataract, bilateral: Secondary | ICD-10-CM | POA: Diagnosis not present

## 2016-09-30 DIAGNOSIS — M159 Polyosteoarthritis, unspecified: Secondary | ICD-10-CM | POA: Diagnosis not present

## 2016-09-30 DIAGNOSIS — Z79899 Other long term (current) drug therapy: Secondary | ICD-10-CM | POA: Diagnosis not present

## 2016-09-30 DIAGNOSIS — E7800 Pure hypercholesterolemia, unspecified: Secondary | ICD-10-CM | POA: Diagnosis not present

## 2016-09-30 DIAGNOSIS — N183 Chronic kidney disease, stage 3 (moderate): Secondary | ICD-10-CM | POA: Diagnosis not present

## 2016-09-30 DIAGNOSIS — J209 Acute bronchitis, unspecified: Secondary | ICD-10-CM | POA: Diagnosis not present

## 2016-09-30 DIAGNOSIS — I119 Hypertensive heart disease without heart failure: Secondary | ICD-10-CM | POA: Diagnosis not present

## 2017-05-18 DIAGNOSIS — N183 Chronic kidney disease, stage 3 (moderate): Secondary | ICD-10-CM | POA: Diagnosis not present

## 2017-05-18 DIAGNOSIS — M159 Polyosteoarthritis, unspecified: Secondary | ICD-10-CM | POA: Diagnosis not present

## 2017-05-18 DIAGNOSIS — E7800 Pure hypercholesterolemia, unspecified: Secondary | ICD-10-CM | POA: Diagnosis not present

## 2017-05-18 DIAGNOSIS — J209 Acute bronchitis, unspecified: Secondary | ICD-10-CM | POA: Diagnosis not present

## 2017-05-18 DIAGNOSIS — Z79899 Other long term (current) drug therapy: Secondary | ICD-10-CM | POA: Diagnosis not present

## 2017-05-18 DIAGNOSIS — I119 Hypertensive heart disease without heart failure: Secondary | ICD-10-CM | POA: Diagnosis not present

## 2018-03-02 DIAGNOSIS — I119 Hypertensive heart disease without heart failure: Secondary | ICD-10-CM | POA: Diagnosis not present

## 2018-03-02 DIAGNOSIS — N183 Chronic kidney disease, stage 3 (moderate): Secondary | ICD-10-CM | POA: Diagnosis not present

## 2018-03-02 DIAGNOSIS — Z79899 Other long term (current) drug therapy: Secondary | ICD-10-CM | POA: Diagnosis not present

## 2018-03-02 DIAGNOSIS — I10 Essential (primary) hypertension: Secondary | ICD-10-CM | POA: Diagnosis not present

## 2018-03-02 DIAGNOSIS — M1990 Unspecified osteoarthritis, unspecified site: Secondary | ICD-10-CM | POA: Diagnosis not present

## 2018-03-02 DIAGNOSIS — E7800 Pure hypercholesterolemia, unspecified: Secondary | ICD-10-CM | POA: Diagnosis not present

## 2018-03-02 DIAGNOSIS — J209 Acute bronchitis, unspecified: Secondary | ICD-10-CM | POA: Diagnosis not present

## 2018-03-02 DIAGNOSIS — M159 Polyosteoarthritis, unspecified: Secondary | ICD-10-CM | POA: Diagnosis not present

## 2018-06-01 ENCOUNTER — Emergency Department (HOSPITAL_COMMUNITY)
Admission: EM | Admit: 2018-06-01 | Discharge: 2018-06-01 | Disposition: A | Discharge status: Home / Self Care | Payer: Medicare Other | Attending: Emergency Medicine | Admitting: Emergency Medicine

## 2018-06-01 ENCOUNTER — Other Ambulatory Visit: Payer: Self-pay

## 2018-06-01 ENCOUNTER — Emergency Department (HOSPITAL_COMMUNITY): Payer: Medicare Other

## 2018-06-01 ENCOUNTER — Encounter (HOSPITAL_COMMUNITY): Payer: Self-pay | Admitting: Emergency Medicine

## 2018-06-01 DIAGNOSIS — Z79899 Other long term (current) drug therapy: Secondary | ICD-10-CM | POA: Diagnosis not present

## 2018-06-01 DIAGNOSIS — I10 Essential (primary) hypertension: Secondary | ICD-10-CM | POA: Diagnosis not present

## 2018-06-01 DIAGNOSIS — F17210 Nicotine dependence, cigarettes, uncomplicated: Secondary | ICD-10-CM | POA: Diagnosis not present

## 2018-06-01 DIAGNOSIS — R079 Chest pain, unspecified: Secondary | ICD-10-CM | POA: Diagnosis not present

## 2018-06-01 LAB — BASIC METABOLIC PANEL
Anion gap: 10 (ref 5–15)
BUN: 19 mg/dL (ref 8–23)
CO2: 19 mmol/L — ABNORMAL LOW (ref 22–32)
Calcium: 9 mg/dL (ref 8.9–10.3)
Chloride: 106 mmol/L (ref 98–111)
Creatinine, Ser: 1.37 mg/dL — ABNORMAL HIGH (ref 0.44–1.00)
GFR calc Af Amer: 44 mL/min — ABNORMAL LOW (ref >60)
GFR calc non Af Amer: 38 mL/min — ABNORMAL LOW (ref >60)
Glucose, Bld: 90 mg/dL (ref 70–99)
Potassium: 4.4 mmol/L (ref 3.5–5.1)
Sodium: 135 mmol/L (ref 135–145)

## 2018-06-01 LAB — CBC WITH DIFFERENTIAL/PLATELET
Abs Immature Granulocytes: 0.05 10*3/uL (ref 0.00–0.07)
Basophils Absolute: 0 10*3/uL (ref 0.0–0.1)
Basophils Relative: 0 %
Eosinophils Absolute: 0.1 10*3/uL (ref 0.0–0.5)
Eosinophils Relative: 2 %
HCT: 42.4 % (ref 36.0–46.0)
Hemoglobin: 12.5 g/dL (ref 12.0–15.0)
Immature Granulocytes: 1 %
Lymphocytes Relative: 31 %
Lymphs Abs: 2.3 10*3/uL (ref 0.7–4.0)
MCH: 26.3 pg (ref 26.0–34.0)
MCHC: 29.5 g/dL — ABNORMAL LOW (ref 30.0–36.0)
MCV: 89.1 fL (ref 80.0–100.0)
Monocytes Absolute: 1 10*3/uL (ref 0.1–1.0)
Monocytes Relative: 14 %
Neutro Abs: 3.9 10*3/uL (ref 1.7–7.7)
Neutrophils Relative %: 52 %
Platelets: 242 10*3/uL (ref 150–400)
RBC: 4.76 MIL/uL (ref 3.87–5.11)
RDW: 16.8 % — ABNORMAL HIGH (ref 11.5–15.5)
WBC: 7.5 10*3/uL (ref 4.0–10.5)
nRBC: 0 % (ref 0.0–0.2)

## 2018-06-01 LAB — TROPONIN I: Troponin I: <0.03 ng/mL (ref <0.03)

## 2018-06-01 NOTE — ED Notes (Signed)
Patient transported to X-ray 

## 2018-06-01 NOTE — ED Provider Notes (Signed)
[facility] EMERGENCY DEPARTMENT Provider Note   CSN: [id number] Arrival date & time: 06/01/18  5449    History   Chief Complaint Chief Complaint  Patient presents with  . Chest Pain    HPI Andrea Meyer is a 73 y.o. female.     HPI   73 year old female with chest pain.  Onset yesterday.  Describes a pulling sensation or pinching at times.  Worse with certain movements and coughing.  She reports that she has bronchitis.  Cough is nonproductive.  Does not feel short of breath.  No unusual leg pain or swelling.  No fevers or chills.  No appreciable change with exertion.  Associated nausea palpitations or diaphoresis.  Past Medical History:  Diagnosis Date  . Hyperlipidemia   . Hypertension     There are no active problems to display for this patient.   Past Surgical History:  Procedure Laterality Date  . BREAST SURGERY     tumor removal     OB History   No obstetric history on file.      Home Medications    Prior to Admission medications   Medication Sig Start Date End Date Taking? Authorizing Provider  Azilsartan Medoxomil (EDARBI) 40 MG TABS Take 40 mg by mouth daily.   Yes [provider]  Azilsartan-Chlorthalidone (EDARBYCLOR) 40-25 MG TABS Take 1 tablet by mouth daily.   Yes [provider]  metoprolol tartrate (LOPRESSOR) 25 MG tablet Take 75 mg by mouth every 12 (twelve) hours.  12/24/15  Yes [provider]  potassium chloride SA (K-DUR,KLOR-CON) 20 MEQ tablet Take 20 mEq by mouth daily.   Yes [provider]  pravastatin (PRAVACHOL) 20 MG tablet Take 20 mg by mouth daily.   Yes [provider]  amoxicillin-clavulanate (AUGMENTIN) 875-125 MG per tablet Take 1 tablet by mouth 2 (two) times daily. One po bid x 7 days Patient not taking: Reported on 01/10/2016 06/02/12   Jaci Carrel, PA-C  atenolol (TENORMIN) 100 MG tablet Take 1 tablet (100 mg total) by mouth daily. Patient not taking: Reported  on 01/10/2016 06/02/12   Jaci Carrel, PA-C    Family History History reviewed. No pertinent family history.  Social History Social History   Tobacco Use  . Smoking status: Current Every Day Smoker    Packs/day: 0.25    Types: Cigarettes  . Smokeless tobacco: Never Used  Substance Use Topics  . Alcohol use: No  . Drug use: No     Allergies   Patient has no known allergies.   Review of Systems Review of Systems  All systems reviewed and negative, other than as noted in HPI. Physical Exam Updated Vital Signs BP (!) 169/57   Pulse 64   Temp 98 F (36.7 C) (Oral)   Resp 16   Ht 5\' 6"  (1.676 m)   Wt 90.7 kg   SpO2 95%   BMI 32.28 kg/m   Physical Exam Vitals signs and nursing note reviewed.  Constitutional:      General: She is not in acute distress.    Appearance: She is well-developed.  HENT:     Head: Normocephalic and atraumatic.  Eyes:     General:        Right eye: No discharge.        Left eye: No discharge.     Conjunctiva/sclera: Conjunctivae normal.  Neck:     Musculoskeletal: Neck supple.  Cardiovascular:     Rate and Rhythm: Normal rate and  regular rhythm.     Heart sounds: Normal heart sounds. No murmur. No friction rub. No gallop.   Pulmonary:     Effort: Pulmonary effort is normal. No respiratory distress.     Breath sounds: Normal breath sounds.  Abdominal:     General: There is no distension.     Palpations: Abdomen is soft.     Tenderness: There is no abdominal tenderness.  Musculoskeletal:        General: No tenderness.     Comments: Lower extremities symmetric as compared to each other. No calf tenderness. Negative Homan's. No palpable cords.   Skin:    General: Skin is warm and dry.  Neurological:     Mental Status: She is alert.  Psychiatric:        Behavior: Behavior normal.        Thought Content: Thought content normal.      ED Treatments / Results  Labs (all labs ordered are listed, but only abnormal results are  displayed) Labs Reviewed  CBC WITH DIFFERENTIAL/PLATELET - Abnormal; Notable for the following components:      Result Value   MCHC 29.5 (*)    RDW 16.8 (*)    All other components within normal limits  BASIC METABOLIC PANEL - Abnormal; Notable for the following components:   CO2 19 (*)    Creatinine, Ser 1.37 (*)    GFR calc non Af Amer 38 (*)    GFR calc Af Amer 44 (*)    All other components within normal limits  TROPONIN I    EKG EKG Interpretation  Date/Time:  Tuesday June 01 2018 07:57:38 EST Ventricular Rate:  75 PR Interval:  158 QRS Duration: 68 QT Interval:  358 QTC Calculation: 399 R Axis:   72 Text Interpretation:  Normal sinus rhythm Normal ECG Confirmed by Raeford Razor [phone]) on 06/01/2018 8:13:14 AM   Radiology Dg Chest 2 View  Result Date: 06/01/2018 CLINICAL DATA:  Central chest pain starting last night. History of breast surgery. EXAM: CHEST - 2 VIEW COMPARISON:  Chest x-ray dated 06/02/2012. FINDINGS: Heart size and mediastinal contours are within normal limits. Lungs are clear. No pleural effusion or pneumothorax seen. Osseous structures about the chest are unremarkable. IMPRESSION: No active cardiopulmonary disease. No evidence of pneumonia or pulmonary edema. Electronically Signed   By: Bary Richard M.D.   On: 06/01/2018 09:31    Procedures Procedures (including critical care time)  Medications Ordered in ED Medications - No data to display   Initial Impression / Assessment and Plan / ED Course  I have reviewed the triage vital signs and the nursing notes.  Pertinent labs & imaging results that were available during my care of the patient were reviewed by me and considered in my medical decision making (see chart for details).        73 year old female with chest pain.  Atypical for ACS.  Doubt PE, dissection or other emergent process.  Seems like there is a pleuritic component.  She is afebrile.  No hypoxemia.  Chest x-ray  clear.  Final Clinical Impressions(s) / ED Diagnoses   Final diagnoses:  Chest pain, unspecified type    ED Discharge Orders    None       Raeford Razor, MD 06/06/18 1301

## 2018-06-01 NOTE — ED Triage Notes (Signed)
Pt here for evaluation of chest pain that started yesterday. Pain worsens with movement and coughing. States she has bronchitis. BP 203/66 in triage. A/Ox4.

## 2018-09-06 DIAGNOSIS — I119 Hypertensive heart disease without heart failure: Secondary | ICD-10-CM | POA: Diagnosis not present

## 2018-09-06 DIAGNOSIS — E7800 Pure hypercholesterolemia, unspecified: Secondary | ICD-10-CM | POA: Diagnosis not present

## 2018-09-06 DIAGNOSIS — Z0001 Encounter for general adult medical examination with abnormal findings: Secondary | ICD-10-CM | POA: Diagnosis not present

## 2018-09-06 DIAGNOSIS — N183 Chronic kidney disease, stage 3 (moderate): Secondary | ICD-10-CM | POA: Diagnosis not present

## 2018-09-06 DIAGNOSIS — Z79899 Other long term (current) drug therapy: Secondary | ICD-10-CM | POA: Diagnosis not present

## 2018-09-06 DIAGNOSIS — J209 Acute bronchitis, unspecified: Secondary | ICD-10-CM | POA: Diagnosis not present

## 2018-09-06 DIAGNOSIS — Z139 Encounter for screening, unspecified: Secondary | ICD-10-CM | POA: Diagnosis not present

## 2018-09-06 DIAGNOSIS — M159 Polyosteoarthritis, unspecified: Secondary | ICD-10-CM | POA: Diagnosis not present

## 2018-12-20 DIAGNOSIS — I119 Hypertensive heart disease without heart failure: Secondary | ICD-10-CM | POA: Diagnosis not present

## 2018-12-20 DIAGNOSIS — Z79899 Other long term (current) drug therapy: Secondary | ICD-10-CM | POA: Diagnosis not present

## 2018-12-20 DIAGNOSIS — Z139 Encounter for screening, unspecified: Secondary | ICD-10-CM | POA: Diagnosis not present

## 2018-12-20 DIAGNOSIS — N183 Chronic kidney disease, stage 3 (moderate): Secondary | ICD-10-CM | POA: Diagnosis not present

## 2018-12-20 DIAGNOSIS — I10 Essential (primary) hypertension: Secondary | ICD-10-CM | POA: Diagnosis not present

## 2019-05-10 DIAGNOSIS — Z79899 Other long term (current) drug therapy: Secondary | ICD-10-CM | POA: Diagnosis not present

## 2019-05-10 DIAGNOSIS — E7800 Pure hypercholesterolemia, unspecified: Secondary | ICD-10-CM | POA: Diagnosis not present

## 2019-05-10 DIAGNOSIS — N183 Chronic kidney disease, stage 3 unspecified: Secondary | ICD-10-CM | POA: Diagnosis not present

## 2019-05-10 DIAGNOSIS — E669 Obesity, unspecified: Secondary | ICD-10-CM | POA: Diagnosis not present

## 2019-05-10 DIAGNOSIS — I119 Hypertensive heart disease without heart failure: Secondary | ICD-10-CM | POA: Diagnosis not present

## 2019-05-10 DIAGNOSIS — M159 Polyosteoarthritis, unspecified: Secondary | ICD-10-CM | POA: Diagnosis not present

## 2019-06-20 DIAGNOSIS — I119 Hypertensive heart disease without heart failure: Secondary | ICD-10-CM | POA: Diagnosis not present

## 2019-06-20 DIAGNOSIS — N183 Chronic kidney disease, stage 3 unspecified: Secondary | ICD-10-CM | POA: Diagnosis not present

## 2019-06-20 DIAGNOSIS — Z79899 Other long term (current) drug therapy: Secondary | ICD-10-CM | POA: Diagnosis not present

## 2019-06-20 DIAGNOSIS — E7800 Pure hypercholesterolemia, unspecified: Secondary | ICD-10-CM | POA: Diagnosis not present

## 2019-06-20 DIAGNOSIS — E669 Obesity, unspecified: Secondary | ICD-10-CM | POA: Diagnosis not present

## 2019-06-20 DIAGNOSIS — M159 Polyosteoarthritis, unspecified: Secondary | ICD-10-CM | POA: Diagnosis not present

## 2019-06-20 DIAGNOSIS — Z139 Encounter for screening, unspecified: Secondary | ICD-10-CM | POA: Diagnosis not present

## 2020-09-25 DIAGNOSIS — Z79899 Other long term (current) drug therapy: Secondary | ICD-10-CM | POA: Diagnosis not present

## 2020-09-25 DIAGNOSIS — E7800 Pure hypercholesterolemia, unspecified: Secondary | ICD-10-CM | POA: Diagnosis not present

## 2020-09-25 DIAGNOSIS — N1832 Chronic kidney disease, stage 3b: Secondary | ICD-10-CM | POA: Diagnosis not present

## 2020-09-25 DIAGNOSIS — N183 Chronic kidney disease, stage 3 unspecified: Secondary | ICD-10-CM | POA: Diagnosis not present

## 2020-09-25 DIAGNOSIS — I119 Hypertensive heart disease without heart failure: Secondary | ICD-10-CM | POA: Diagnosis not present

## 2020-09-25 DIAGNOSIS — E669 Obesity, unspecified: Secondary | ICD-10-CM | POA: Diagnosis not present

## 2020-09-25 DIAGNOSIS — M159 Polyosteoarthritis, unspecified: Secondary | ICD-10-CM | POA: Diagnosis not present

## 2020-09-25 DIAGNOSIS — R600 Localized edema: Secondary | ICD-10-CM | POA: Diagnosis not present

## 2020-11-30 ENCOUNTER — Encounter (HOSPITAL_COMMUNITY): Payer: Self-pay

## 2020-11-30 ENCOUNTER — Ambulatory Visit (HOSPITAL_COMMUNITY)
Admission: EM | Admit: 2020-11-30 | Discharge: 2020-11-30 | Disposition: A | Discharge status: Home / Self Care | Payer: Medicare Other

## 2020-11-30 DIAGNOSIS — S39012A Strain of muscle, fascia and tendon of lower back, initial encounter: Secondary | ICD-10-CM

## 2020-11-30 MED ORDER — DEXAMETHASONE SODIUM PHOSPHATE 10 MG/ML IJ SOLN
INTRAMUSCULAR | Status: AC
  Filled 2020-11-30: qty 1

## 2020-11-30 MED ORDER — DICLOFENAC SODIUM 1 % EX GEL: 4.0000 g | Freq: Four times a day (QID) | CUTANEOUS | 2 refills | Status: AC

## 2020-11-30 MED ORDER — CYCLOBENZAPRINE HCL 5 MG PO TABS: 5.0000 mg | ORAL_TABLET | Freq: Every evening | ORAL | 0 refills | Status: DC | PRN

## 2020-11-30 MED ORDER — DEXAMETHASONE SODIUM PHOSPHATE 10 MG/ML IJ SOLN
10.0000 mg | Freq: Once | INTRAMUSCULAR | Status: AC
  Administered 2020-11-30: 10 mg via INTRAMUSCULAR

## 2020-11-30 NOTE — ED Triage Notes (Signed)
Pt c/o muscle spasm pain in right lower back that radiates to buttuck and right leg.  Started: 6 Days ago

## 2020-11-30 NOTE — ED Provider Notes (Signed)
[facility]    CSN: [id number] Arrival date & time: 11/30/20  7681      History   Chief Complaint Chief Complaint  Patient presents with   Spasms    HPI Andrea Meyer is a 75 y.o. female.   Patient presenting today with 6-day history of muscle spasms and significant pain in the right lateral low back that sometimes radiates down into the buttock and posterior leg.  States that sitting for long periods and weightbearing seem to make the pain significantly worse, rest on the opposite side of the body improve the pain.  Denies swelling, numbness, tingling, leg weakness, bowel or bladder incontinence, saddle paresthesias.  No known falls or other injury but does drive a taxi for a living and states that she often forgets to get up and stretch throughout the day because she gets so busy.  Has been trying Voltaren gel and heat with mild temporary relief.   Past Medical History:  Diagnosis Date   Hyperlipidemia    Hypertension     There are no problems to display for this patient.   Past Surgical History:  Procedure Laterality Date   BREAST SURGERY     tumor removal    OB History   No obstetric history on file.      Home Medications    Prior to Admission medications   Medication Sig Start Date End Date Taking? Authorizing Provider  cloNIDine (CATAPRES) 0.2 MG tablet Take 0.2 mg by mouth daily.   Yes [provider]  cyclobenzaprine (FLEXERIL) 5 MG tablet Take 1 tablet (5 mg total) by mouth at bedtime as needed for muscle spasms. DO NOT DRINK ALCOHOL OR DRIVE WHILE TAKING THIS MEDICATION - MAY CAUSE DROWSINESS 11/30/20  Yes Particia Nearing, PA-C  diclofenac Sodium (VOLTAREN) 1 % GEL Apply 4 g topically 4 (four) times daily. 11/30/20  Yes Particia Nearing, PA-C  furosemide (LASIX) 40 MG tablet Take 40 mg by mouth daily.   Yes [provider]  metoprolol tartrate (LOPRESSOR) 100 MG tablet Take 100 mg by mouth daily.   Yes [provider]  amoxicillin-clavulanate (AUGMENTIN) 875-125 MG per tablet Take 1 tablet by mouth 2 (two) times daily. One po bid x 7 days Patient not taking: Reported on 01/10/2016 06/02/12   Jaci Carrel, PA-C  atenolol (TENORMIN) 100 MG tablet Take 1 tablet (100 mg total) by mouth daily. Patient not taking: Reported on 01/10/2016 06/02/12   Jaci Carrel, PA-C  Azilsartan Medoxomil (EDARBI) 40 MG TABS Take 40 mg by mouth daily.    [provider]  Azilsartan-Chlorthalidone (EDARBYCLOR) 40-25 MG TABS Take 1 tablet by mouth daily.    [provider]  metoprolol tartrate (LOPRESSOR) 25 MG tablet Take 75 mg by mouth every 12 (twelve) hours.  12/24/15   [provider]  potassium chloride SA (K-DUR,KLOR-CON) 20 MEQ tablet Take 20 mEq by mouth daily.    [provider]  pravastatin (PRAVACHOL) 20 MG tablet Take 20 mg by mouth daily.    [provider]    Family History History reviewed. No pertinent family history.  Social History Social History   Tobacco Use   Smoking status: Every Day    Packs/day: 0.25    Types: Cigarettes   Smokeless tobacco: Never  Substance Use Topics   Alcohol use: No   Drug use: No     Allergies   Patient has no known allergies.   Review of Systems Review of Systems  Per HPI  Physical Exam Triage Vital Signs ED Triage Vitals  Enc Vitals Group     BP 11/30/20 1023 (!) 167/76     Pulse Rate 11/30/20 1023 74     Resp 11/30/20 1023 18     Temp 11/30/20 1023 98.9 F (37.2 C)     Temp Source 11/30/20 1023 Oral     SpO2 11/30/20 1023 99 %     Weight --      Height --      Head Circumference --      Peak Flow --      Pain Score 11/30/20 1024 10     Pain Loc --      Pain Edu? --      Excl. in GC? --    No data found.  Updated Vital Signs BP (!) 167/76 (BP Location: Left Arm)   Pulse 74   Temp 98.9 F (37.2 C) (Oral)   Resp 18   SpO2 99%   Visual Acuity Right Eye Distance:   Left Eye Distance:    Bilateral Distance:    Right Eye Near:   Left Eye Near:    Bilateral Near:     Physical Exam Vitals and nursing note reviewed.  Constitutional:      Appearance: Normal appearance. She is not ill-appearing.  HENT:     Head: Atraumatic.     Mouth/Throat:     Mouth: Mucous membranes are moist.  Eyes:     Extraocular Movements: Extraocular movements intact.     Conjunctiva/sclera: Conjunctivae normal.  Cardiovascular:     Rate and Rhythm: Normal rate and regular rhythm.     Heart sounds: Normal heart sounds.  Pulmonary:     Effort: Pulmonary effort is normal.     Breath sounds: Normal breath sounds.  Musculoskeletal:        General: Tenderness present. No swelling, deformity or signs of injury. Normal range of motion.     Cervical back: Normal range of motion and neck supple.     Comments: In wheelchair due to pain with ambulation, ambulatory at baseline.  Range of motion does appear intact on exam Moderate tenderness palpation right lateral lumbar musculature setting into right buttock posteriorly No midline spinal tenderness palpation diffusely.  Negative straight leg raise bilateral lower extremities  Skin:    General: Skin is warm and dry.     Findings: No bruising or erythema.  Neurological:     Mental Status: She is alert and oriented to person, place, and time.     Comments: Bilateral lower extremities neurovascular intact  Psychiatric:        Mood and Affect: Mood normal.        Thought Content: Thought content normal.        Judgment: Judgment normal.     UC Treatments / Results  Labs (all labs ordered are listed, but only abnormal results are displayed) Labs Reviewed - No data to display  EKG   Radiology No results found.  Procedures Procedures (including critical care time)  Medications Ordered in UC Medications  dexamethasone (DECADRON) injection 10 mg (10 mg Intramuscular Given 11/30/20 1051)    Initial Impression / Assessment and Plan / UC  Course  I have reviewed the triage vital signs and the nursing notes.  Pertinent labs & imaging results that were available during my care of the patient were reviewed by me and considered in my medical decision making (see chart for details).  Consistent with a lumbar strain radiating down into the buttock muscles.  Treat with IM Decadron, Flexeril, Voltaren gel, heat, stretches.  Discussed taking frequent breaks at work to stretch and walk around.  Follow-up if worsening or not resolving.  Final Clinical Impressions(s) / UC Diagnoses   Final diagnoses:  Strain of lumbar region, initial encounter   Discharge Instructions   None    ED Prescriptions     Medication Sig Dispense Auth. Provider   cyclobenzaprine (FLEXERIL) 5 MG tablet Take 1 tablet (5 mg total) by mouth at bedtime as needed for muscle spasms. DO NOT DRINK ALCOHOL OR DRIVE WHILE TAKING THIS MEDICATION - MAY CAUSE DROWSINESS 10 tablet Particia Nearing, PA-C   diclofenac Sodium (VOLTAREN) 1 % GEL Apply 4 g topically 4 (four) times daily. 150 g Particia Nearing, [place]      PDMP not reviewed this encounter.   Particia Nearing, [place] 11/30/20 1130

## 2020-12-03 ENCOUNTER — Emergency Department (HOSPITAL_COMMUNITY): Admission: EM | Admit: 2020-12-03 | Payer: Medicare Other | Source: Home / Self Care

## 2020-12-06 DIAGNOSIS — M2550 Pain in unspecified joint: Secondary | ICD-10-CM | POA: Diagnosis not present

## 2020-12-06 DIAGNOSIS — E669 Obesity, unspecified: Secondary | ICD-10-CM | POA: Diagnosis not present

## 2020-12-06 DIAGNOSIS — R609 Edema, unspecified: Secondary | ICD-10-CM | POA: Diagnosis not present

## 2020-12-06 DIAGNOSIS — N183 Chronic kidney disease, stage 3 unspecified: Secondary | ICD-10-CM | POA: Diagnosis not present

## 2020-12-06 DIAGNOSIS — R8281 Pyuria: Secondary | ICD-10-CM | POA: Diagnosis not present

## 2020-12-06 DIAGNOSIS — Z79899 Other long term (current) drug therapy: Secondary | ICD-10-CM | POA: Diagnosis not present

## 2020-12-06 DIAGNOSIS — M159 Polyosteoarthritis, unspecified: Secondary | ICD-10-CM | POA: Diagnosis not present

## 2020-12-06 DIAGNOSIS — M5136 Other intervertebral disc degeneration, lumbar region: Secondary | ICD-10-CM | POA: Diagnosis not present

## 2020-12-06 DIAGNOSIS — I119 Hypertensive heart disease without heart failure: Secondary | ICD-10-CM | POA: Diagnosis not present

## 2020-12-06 DIAGNOSIS — N390 Urinary tract infection, site not specified: Secondary | ICD-10-CM | POA: Diagnosis not present

## 2020-12-06 DIAGNOSIS — E7800 Pure hypercholesterolemia, unspecified: Secondary | ICD-10-CM | POA: Diagnosis not present

## 2020-12-06 DIAGNOSIS — M545 Low back pain, unspecified: Secondary | ICD-10-CM | POA: Diagnosis not present

## 2020-12-07 ENCOUNTER — Ambulatory Visit (HOSPITAL_COMMUNITY)
Admission: EM | Admit: 2020-12-07 | Discharge: 2020-12-07 | Disposition: A | Discharge status: Home / Self Care | Payer: Medicare Other | Attending: Family Medicine | Admitting: Family Medicine

## 2020-12-07 ENCOUNTER — Ambulatory Visit (HOSPITAL_COMMUNITY): Payer: Medicare Other

## 2020-12-07 ENCOUNTER — Encounter (HOSPITAL_COMMUNITY): Payer: Self-pay | Admitting: Emergency Medicine

## 2020-12-07 ENCOUNTER — Other Ambulatory Visit: Payer: Self-pay

## 2020-12-07 ENCOUNTER — Ambulatory Visit (INDEPENDENT_AMBULATORY_CARE_PROVIDER_SITE_OTHER): Payer: Medicare Other

## 2020-12-07 DIAGNOSIS — S39012D Strain of muscle, fascia and tendon of lower back, subsequent encounter: Secondary | ICD-10-CM | POA: Diagnosis not present

## 2020-12-07 DIAGNOSIS — M25559 Pain in unspecified hip: Secondary | ICD-10-CM | POA: Diagnosis not present

## 2020-12-07 DIAGNOSIS — M545 Low back pain, unspecified: Secondary | ICD-10-CM

## 2020-12-07 MED ORDER — DEXAMETHASONE SODIUM PHOSPHATE 10 MG/ML IJ SOLN
10.0000 mg | Freq: Once | INTRAMUSCULAR | Status: AC
  Administered 2020-12-07: 10 mg via INTRAMUSCULAR

## 2020-12-07 MED ORDER — DEXAMETHASONE SODIUM PHOSPHATE 10 MG/ML IJ SOLN
INTRAMUSCULAR | Status: AC
  Filled 2020-12-07: qty 1

## 2020-12-07 MED ORDER — CYCLOBENZAPRINE HCL 5 MG PO TABS: 5.0000 mg | ORAL_TABLET | Freq: Every evening | ORAL | 0 refills | Status: DC | PRN

## 2020-12-07 NOTE — ED Triage Notes (Signed)
Pt reports that still having back pains, muscle spasms and pain in right leg that been ongoing for 2 weeks.  Pt saw provider yesterday and has an order for xray with her today but wants to figure out what is causing the pain so wants to be seen today.

## 2020-12-11 NOTE — ED Provider Notes (Signed)
[facility]    CSN: [id number] Arrival date & time: 12/07/20  1004      History   Chief Complaint Chief Complaint  Patient presents with   Back Pain   Spasms    HPI Andrea Meyer is a 75 y.o. female.   Presenting today following up on ongoing right lateral low back spasms and pain. States the area is very stiff, worse in the mornings or if sitting still too long. Denies radiation of pain down legs, weakness, numbness, tingling, bowel or bladder incontinence, fever, chills, saddle paresthesias. Given IM decadron and muscle relaxers at recent UC visit which only helped mildly. Saw PCP yesterday and was given norco but did not yet get it filled. Requesting an x-ray. No known injury.    Past Medical History:  Diagnosis Date   Hyperlipidemia    Hypertension     There are no problems to display for this patient.   Past Surgical History:  Procedure Laterality Date   BREAST SURGERY     tumor removal    OB History   No obstetric history on file.      Home Medications    Prior to Admission medications   Medication Sig Start Date End Date Taking? Authorizing Provider  amoxicillin-clavulanate (AUGMENTIN) 875-125 MG per tablet Take 1 tablet by mouth 2 (two) times daily. One po bid x 7 days Patient not taking: Reported on 01/10/2016 06/02/12   Jaci Carrel, PA-C  atenolol (TENORMIN) 100 MG tablet Take 1 tablet (100 mg total) by mouth daily. Patient not taking: Reported on 01/10/2016 06/02/12   Jaci Carrel, PA-C  Azilsartan Medoxomil (EDARBI) 40 MG TABS Take 40 mg by mouth daily.    [provider]  Azilsartan-Chlorthalidone (EDARBYCLOR) 40-25 MG TABS Take 1 tablet by mouth daily.    [provider]  cloNIDine (CATAPRES) 0.2 MG tablet Take 0.2 mg by mouth daily.    [provider]  cyclobenzaprine (FLEXERIL) 5 MG tablet Take 1 tablet (5 mg total) by mouth at bedtime as needed for muscle spasms. DO NOT DRINK ALCOHOL OR DRIVE WHILE TAKING THIS  MEDICATION - MAY CAUSE DROWSINESS 12/07/20   Particia Nearing, PA-C  diclofenac Sodium (VOLTAREN) 1 % GEL Apply 4 g topically 4 (four) times daily. 11/30/20   Particia Nearing, PA-C  furosemide (LASIX) 40 MG tablet Take 40 mg by mouth daily.    [provider]  metoprolol tartrate (LOPRESSOR) 100 MG tablet Take 100 mg by mouth daily.    [provider]  metoprolol tartrate (LOPRESSOR) 25 MG tablet Take 75 mg by mouth every 12 (twelve) hours.  12/24/15   [provider]  potassium chloride SA (K-DUR,KLOR-CON) 20 MEQ tablet Take 20 mEq by mouth daily.    [provider]  pravastatin (PRAVACHOL) 20 MG tablet Take 20 mg by mouth daily.    [provider]    Family History No family history on file.  Social History Social History   Tobacco Use   Smoking status: Every Day    Packs/day: 0.25    Types: Cigarettes   Smokeless tobacco: Never  Substance Use Topics   Alcohol use: No   Drug use: No     Allergies   Patient has no known allergies.   Review of Systems Review of Systems PER HPI   Physical Exam Triage Vital Signs ED Triage Vitals  Enc Vitals Group     BP 12/07/20 1036 (!) 152/68     Pulse  Rate 12/07/20 1036 80     Resp 12/07/20 1045 18     Temp 12/07/20 1036 98.7 F (37.1 C)     Temp Source 12/07/20 1036 Oral     SpO2 12/07/20 1036 97 %     Weight --      Height --      Head Circumference --      Peak Flow --      Pain Score 12/07/20 1043 10     Pain Loc --      Pain Edu? --      Excl. in GC? --    No data found.  Updated Vital Signs BP (!) 152/68 (BP Location: Right Wrist)   Pulse 80   Temp 98.7 F (37.1 C) (Oral)   Resp 18   SpO2 97%   Visual Acuity Right Eye Distance:   Left Eye Distance:   Bilateral Distance:    Right Eye Near:   Left Eye Near:    Bilateral Near:     Physical Exam Vitals and nursing note reviewed.  Constitutional:      Appearance: Normal appearance. She is not  ill-appearing.  HENT:     Head: Atraumatic.  Eyes:     Extraocular Movements: Extraocular movements intact.     Conjunctiva/sclera: Conjunctivae normal.  Cardiovascular:     Rate and Rhythm: Normal rate and regular rhythm.     Heart sounds: Normal heart sounds.  Pulmonary:     Effort: Pulmonary effort is normal.     Breath sounds: Normal breath sounds.  Musculoskeletal:     Cervical back: Normal range of motion and neck supple.     Comments: In wheelchair, ambulatory at baseline. - SLR b/l LEs. Significantly ttp right lateral lumbar musculature. No midline spinal ttp diffusely  Skin:    General: Skin is warm and dry.  Neurological:     Mental Status: She is alert and oriented to person, place, and time.     Comments: B/l lower extremities neurovascularly intact  Psychiatric:        Mood and Affect: Mood normal.        Thought Content: Thought content normal.        Judgment: Judgment normal.     UC Treatments / Results  Labs (all labs ordered are listed, but only abnormal results are displayed) Labs Reviewed - No data to display  EKG   Radiology No results found.  Procedures Procedures (including critical care time)  Medications Ordered in UC Medications  dexamethasone (DECADRON) injection 10 mg (10 mg Intramuscular Given 12/07/20 1243)    Initial Impression / Assessment and Plan / UC Course  I have reviewed the triage vital signs and the nursing notes.  Pertinent labs & imaging results that were available during my care of the patient were reviewed by me and considered in my medical decision making (see chart for details).     X-ray lumbar spine showing degenerative changes, unable to obtain right hip x-ray that patient requested due to pain with positioning. Reassurance given. Will give another dose of IM decadron and continue muscle relaxers, supportive home care. F/u with PCP for recheck.   Final Clinical Impressions(s) / UC Diagnoses   Final diagnoses:   Hip pain  Strain of lumbar region, subsequent encounter   Discharge Instructions   None    ED Prescriptions     Medication Sig Dispense Auth. Provider   cyclobenzaprine (FLEXERIL) 5 MG tablet Take 1 tablet (5 mg total)  by mouth at bedtime as needed for muscle spasms. DO NOT DRINK ALCOHOL OR DRIVE WHILE TAKING THIS MEDICATION - MAY CAUSE DROWSINESS 15 tablet Particia Nearing, [place]      PDMP not reviewed this encounter.   Particia Nearing, [place] 12/11/20 2255

## 2020-12-18 DIAGNOSIS — M545 Low back pain, unspecified: Secondary | ICD-10-CM | POA: Diagnosis not present

## 2021-08-08 DIAGNOSIS — M62838 Other muscle spasm: Secondary | ICD-10-CM | POA: Diagnosis not present

## 2021-08-08 DIAGNOSIS — N1832 Chronic kidney disease, stage 3b: Secondary | ICD-10-CM | POA: Diagnosis not present

## 2021-08-08 DIAGNOSIS — M545 Low back pain, unspecified: Secondary | ICD-10-CM | POA: Diagnosis not present

## 2021-08-08 DIAGNOSIS — R609 Edema, unspecified: Secondary | ICD-10-CM | POA: Diagnosis not present

## 2021-08-08 DIAGNOSIS — E669 Obesity, unspecified: Secondary | ICD-10-CM | POA: Diagnosis not present

## 2021-08-08 DIAGNOSIS — Z79899 Other long term (current) drug therapy: Secondary | ICD-10-CM | POA: Diagnosis not present

## 2021-08-08 DIAGNOSIS — M2550 Pain in unspecified joint: Secondary | ICD-10-CM | POA: Diagnosis not present

## 2021-08-08 DIAGNOSIS — I119 Hypertensive heart disease without heart failure: Secondary | ICD-10-CM | POA: Diagnosis not present

## 2021-08-08 DIAGNOSIS — E7800 Pure hypercholesterolemia, unspecified: Secondary | ICD-10-CM | POA: Diagnosis not present

## 2021-08-08 DIAGNOSIS — M1990 Unspecified osteoarthritis, unspecified site: Secondary | ICD-10-CM | POA: Diagnosis not present

## 2021-08-08 DIAGNOSIS — R8281 Pyuria: Secondary | ICD-10-CM | POA: Diagnosis not present

## 2024-03-29 ENCOUNTER — Emergency Department (HOSPITAL_COMMUNITY)
Admission: EM | Admit: 2024-03-29 | Discharge: 2024-03-30 | Disposition: A | Discharge status: Skilled Nursing Facility | Attending: Emergency Medicine | Admitting: Emergency Medicine

## 2024-03-29 ENCOUNTER — Other Ambulatory Visit: Payer: Self-pay

## 2024-03-29 ENCOUNTER — Encounter (HOSPITAL_COMMUNITY): Payer: Self-pay

## 2024-03-29 ENCOUNTER — Emergency Department (HOSPITAL_COMMUNITY)

## 2024-03-29 DIAGNOSIS — W19XXXA Unspecified fall, initial encounter: Secondary | ICD-10-CM | POA: Insufficient documentation

## 2024-03-29 DIAGNOSIS — I10 Essential (primary) hypertension: Secondary | ICD-10-CM | POA: Insufficient documentation

## 2024-03-29 DIAGNOSIS — N261 Atrophy of kidney (terminal): Secondary | ICD-10-CM | POA: Diagnosis not present

## 2024-03-29 DIAGNOSIS — I739 Peripheral vascular disease, unspecified: Secondary | ICD-10-CM | POA: Insufficient documentation

## 2024-03-29 DIAGNOSIS — Z79899 Other long term (current) drug therapy: Secondary | ICD-10-CM | POA: Insufficient documentation

## 2024-03-29 DIAGNOSIS — F17210 Nicotine dependence, cigarettes, uncomplicated: Secondary | ICD-10-CM | POA: Insufficient documentation

## 2024-03-29 DIAGNOSIS — G8929 Other chronic pain: Secondary | ICD-10-CM | POA: Insufficient documentation

## 2024-03-29 DIAGNOSIS — D649 Anemia, unspecified: Secondary | ICD-10-CM | POA: Diagnosis not present

## 2024-03-29 DIAGNOSIS — N184 Chronic kidney disease, stage 4 (severe): Secondary | ICD-10-CM

## 2024-03-29 DIAGNOSIS — M25561 Pain in right knee: Secondary | ICD-10-CM | POA: Diagnosis present

## 2024-03-29 LAB — BASIC METABOLIC PANEL WITH GFR
Anion gap: 13 (ref 5–15)
Anion gap: 12 (ref 5–15)
BUN: 39 mg/dL — ABNORMAL HIGH (ref 8–23)
BUN: 38 mg/dL — ABNORMAL HIGH (ref 8–23)
CO2: 13 mmol/L — ABNORMAL LOW (ref 22–32)
CO2: 13 mmol/L — ABNORMAL LOW (ref 22–32)
Calcium: 9 mg/dL (ref 8.9–10.3)
Calcium: 8.4 mg/dL — ABNORMAL LOW (ref 8.9–10.3)
Chloride: 112 mmol/L — ABNORMAL HIGH (ref 98–111)
Chloride: 114 mmol/L — ABNORMAL HIGH (ref 98–111)
Creatinine, Ser: 3.29 mg/dL — ABNORMAL HIGH (ref 0.44–1.00)
Creatinine, Ser: 3.14 mg/dL — ABNORMAL HIGH (ref 0.44–1.00)
GFR, Estimated: 14 mL/min — ABNORMAL LOW
GFR, Estimated: 14 mL/min — ABNORMAL LOW
Glucose, Bld: 117 mg/dL — ABNORMAL HIGH (ref 70–99)
Glucose, Bld: 90 mg/dL (ref 70–99)
Potassium: 4.7 mmol/L (ref 3.5–5.1)
Potassium: 4.8 mmol/L (ref 3.5–5.1)
Sodium: 138 mmol/L (ref 135–145)
Sodium: 139 mmol/L (ref 135–145)

## 2024-03-29 LAB — PROTIME-INR
INR: 1 (ref 0.8–1.2)
Prothrombin Time: 13.8 s (ref 11.4–15.2)

## 2024-03-29 LAB — CBC
HCT: 32.5 % — ABNORMAL LOW (ref 36.0–46.0)
Hemoglobin: 10.2 g/dL — ABNORMAL LOW (ref 12.0–15.0)
MCH: 31.2 pg (ref 26.0–34.0)
MCHC: 31.4 g/dL (ref 30.0–36.0)
MCV: 99.4 fL (ref 80.0–100.0)
Platelets: 219 K/uL (ref 150–400)
RBC: 3.27 MIL/uL — ABNORMAL LOW (ref 3.87–5.11)
RDW: 20.6 % — ABNORMAL HIGH (ref 11.5–15.5)
WBC: 8.3 K/uL (ref 4.0–10.5)
nRBC: 0 % (ref 0.0–0.2)

## 2024-03-29 LAB — CK: Total CK: 98 U/L (ref 38–234)

## 2024-03-29 MED ORDER — ACETAMINOPHEN 325 MG PO TABS
650.0000 mg | ORAL_TABLET | Freq: Once | ORAL | Status: AC
  Administered 2024-03-29: 650 mg via ORAL
  Filled 2024-03-29: qty 2

## 2024-03-29 MED ORDER — SODIUM CHLORIDE 0.9 % IV BOLUS
500.0000 mL | Freq: Once | INTRAVENOUS | Status: AC
  Administered 2024-03-29: 500 mL via INTRAVENOUS

## 2024-03-29 NOTE — NC FL2 (Signed)
 " [place]  MEDICAID FL2 LEVEL OF CARE FORM     IDENTIFICATION  Patient Name: CLARISSIA MCKEEN Birthdate: 06/23/1945 Sex: female Admission Date (Current Location): 03/29/2024  [facility] and [place] Number:  Producer, Television/film/video and Address:  [facility], [address], [place], [place] [address]      Provider Number:    Attending Physician Name and Address:  Rogelia Jerilynn RAMAN, MD  Relative Name and Phone Number:  Oni Dietzman (brother) [phone]; Rankin Davies) [phone]    Current Level of Care: Hospital Recommended Level of Care: Skilled Nursing Facility Prior Approval Number:    Date Approved/Denied:   PASRR Number: [id number]  Discharge Plan: SNF    Current Diagnoses: There are no active problems to display for this patient.   Orientation RESPIRATION BLADDER Height & Weight     Self, Time, Situation, Place  Normal Continent Weight:   Height:     BEHAVIORAL SYMPTOMS/MOOD NEUROLOGICAL BOWEL NUTRITION STATUS      Continent Diet (See D/C Summary)  AMBULATORY STATUS COMMUNICATION OF NEEDS Skin   Limited Assist Verbally Normal                       Personal Care Assistance Level of Assistance  Bathing, Feeding, Dressing Bathing Assistance: Limited assistance Feeding assistance: Independent Dressing Assistance: Limited assistance     Functional Limitations Info  Hearing, Speech, Sight Sight Info: Adequate Hearing Info: Adequate Speech Info: Adequate    SPECIAL CARE FACTORS FREQUENCY  PT (By licensed PT), OT (By licensed OT)     PT Frequency: 5x/week OT Frequency: 5x/week            Contractures Contractures Info: Not present    Additional Factors Info  Code Status, Allergies Code Status Info: Not on file Allergies Info: No Known Allergies           Current Medications (03/29/2024):  This is the current hospital active medication list No current facility-administered medications for this encounter.    Current Outpatient Medications  Medication Sig Dispense Refill   amoxicillin -clavulanate (AUGMENTIN ) 875-125 MG per tablet Take 1 tablet by mouth 2 (two) times daily. One po bid x 7 days (Patient not taking: Reported on 01/10/2016) 14 tablet 0   atenolol  (TENORMIN ) 100 MG tablet Take 1 tablet (100 mg total) by mouth daily. (Patient not taking: Reported on 01/10/2016) 30 tablet 0   Azilsartan Medoxomil (EDARBI) 40 MG TABS Take 40 mg by mouth daily.     Azilsartan-Chlorthalidone  (EDARBYCLOR) 40-25 MG TABS Take 1 tablet by mouth daily.     cloNIDine (CATAPRES) 0.2 MG tablet Take 0.2 mg by mouth daily.     cyclobenzaprine  (FLEXERIL ) 5 MG tablet Take 1 tablet (5 mg total) by mouth at bedtime as needed for muscle spasms. DO NOT DRINK ALCOHOL OR DRIVE WHILE TAKING THIS MEDICATION - MAY CAUSE DROWSINESS 15 tablet 0   diclofenac  Sodium (VOLTAREN ) 1 % GEL Apply 4 g topically 4 (four) times daily. 150 g 2   furosemide  (LASIX ) 40 MG tablet Take 40 mg by mouth daily.     metoprolol  tartrate (LOPRESSOR ) 100 MG tablet Take 100 mg by mouth daily.     metoprolol  tartrate (LOPRESSOR ) 25 MG tablet Take 75 mg by mouth every 12 (twelve) hours.      potassium chloride  SA (K-DUR,KLOR-CON ) 20 MEQ tablet Take 20 mEq by mouth daily.     pravastatin (PRAVACHOL) 20 MG tablet Take 20 mg by mouth daily.  Discharge Medications: Please see discharge summary for a list of discharge medications.  Relevant Imaging Results:  Relevant Lab Results:   Additional Information SSN# [id number]  Bernardino Dean, LCSWA     "

## 2024-03-29 NOTE — Progress Notes (Signed)
 SNF consult received. Awaiting PT evaluation to determine further dc disposition. CSW to follow.

## 2024-03-29 NOTE — Progress Notes (Signed)
 Andrea Meyer is a 78 yo f who presents to the ED for evaluation after falling at home. Recent hx of frequent falls. SW consult received for potential placement.   Met with patient and her support person, her pastor, Rankin [phone]), at bedside. Patient shares that she lives alone and has limited support in the community, Rankin states that he is her main advocate. They share an overall decline in functional status, safety, and quality of life in the setting of increasing ambulatory deficits. They share a concern regarding patient's safety if she were to return home, alone. At the time, a PT consult was pending. We agreed to wait for further PT input.   PT completed evaluation and recommended SNF. Discussed with attending. With no current consideration for admission, plan to make boarder and pursue SNF from ED setting. Education on SNF level of care and process provided to patient and Rankin at bedside. Patient agreeable to pursuing SNF. Patient without SNF preferences, would prefer to remain in [place]. Rankin states his dad has been to [facility] before and that was a positive experience. All in agreement to review accepting SNF's in AM. Patient is hoping to get a room, currently in hallway bed. Is worried about getting clothes and certain food items, especially while at SNF, Rankin states he will assist.   PASRR and FL2 completed. Referrals launched, would not expect responses until the AM. TOC following.

## 2024-03-29 NOTE — Progress Notes (Signed)
 Physical Therapy Quick Note  PT has completed initial evaluation.    Overall, patient at min assistance level.   PT Follow up recommended: Inpatient follow up therapy, < 3 hours/day Equipment recommended:  BSC/3n1, Manual wheelchair are recommended if the patient declines inpatient PT services and elects to discharge home. Complete evaluation note to follow.     Bernardino JINNY Ruth, PT, DPT Acute Rehabilitation Office [phone]

## 2024-03-29 NOTE — Evaluation (Signed)
 Physical Therapy Evaluation Patient Details Name: Andrea Meyer MRN: [id number] DOB: [birth year] Today's Date: 03/29/2024  History of Present Illness  78 y.o. female presents to [facility] on 03/29/2024 after a fall and with bilateral great toe pain. PMH includes HLD, HTN.  Clinical Impression  Pt presents to PT with deficits in functional mobility, gait, balance, strength, power, endurance. Pt's activity tolerance is limited by impaired muscular endurance along with pain in bilateral feet and knees. Pt lives alone and has no consistent caregiver support to assist her in mobilizing or in ADLs. Patient will benefit from continued inpatient follow up therapy, <3 hours/day.        If plan is discharge home, recommend the following: A little help with walking and/or transfers;A little help with bathing/dressing/bathroom;Assistance with cooking/housework;Assist for transportation;Help with stairs or ramp for entrance   Can travel by private vehicle   Yes    Equipment Recommendations BSC/3in1;Wheelchair (measurements PT);Wheelchair cushion (measurements PT)  Recommendations for Other Services       Functional Status Assessment Patient has had a recent decline in their functional status and demonstrates the ability to make significant improvements in function in a reasonable and predictable amount of time.     Precautions / Restrictions Precautions Precautions: Fall Recall of Precautions/Restrictions: Intact Restrictions Weight Bearing Restrictions Per Provider Order: No      Mobility  Bed Mobility Overal bed mobility: Needs Assistance Bed Mobility: Supine to Sit, Sit to Supine     Supine to sit: Min assist Sit to supine: Min assist        Transfers Overall transfer level: Needs assistance Equipment used: Rolling Seehafer (2 wheels) Transfers: Sit to/from Stand Sit to Stand: Contact guard assist, From elevated surface                 Ambulation/Gait Ambulation/Gait assistance: Min assist Gait Distance (Feet): 20 Feet Assistive device: Rolling [place]el[place] (2 wheels) Gait Pattern/deviations: Step-to pattern Gait velocity: reduced Gait velocity interpretation: <1.31 ft/sec, indicative of household ambulator   General Gait Details: slowed step-to gait with increased trunk flexion over RW  Stairs            Wheelchair Mobility     Tilt Bed    Modified Rankin (Stroke Patients Only)       Balance Overall balance assessment: Needs assistance Sitting-balance support: No upper extremity supported, Feet supported Sitting balance-Leahy Scale: Fair     Standing balance support: Bilateral upper extremity supported, Reliant on assistive device for balance Standing balance-Leahy Scale: Poor                               Pertinent Vitals/Pain Pain Assessment Pain Assessment: Faces Faces Pain Scale: Hurts even more Pain Location: toes, knees and back Pain Descriptors / Indicators: Aching Pain Intervention(s): Monitored during session    Home Living Family/patient expects to be discharged to:: Other (Comment)                   Additional Comments: Motel, 4 STE with rails on right and left. Pt owns a RW but no other DME, has a tub shower    Prior Function Prior Level of Function : Independent/Modified Independent             Mobility Comments: pt reports she is able to ambulate for household distances, has been having increasing difficulty mobilizing due to chronic knee pain       Extremity/Trunk Assessment  Upper Extremity Assessment Upper Extremity Assessment: Generalized weakness    Lower Extremity Assessment Lower Extremity Assessment: Generalized weakness    Cervical / Trunk Assessment Cervical / Trunk Assessment: Kyphotic  Communication   Communication Communication: No apparent difficulties    Cognition Arousal: Alert Behavior During Therapy: WFL for tasks  assessed/performed   PT - Cognitive impairments: No apparent impairments                         Following commands: Intact       Cueing Cueing Techniques: Verbal cues     General Comments General comments (skin integrity, edema, etc.): pt in NAD    Exercises     Assessment/Plan    PT Assessment Patient needs continued PT services  PT Problem List Decreased strength;Decreased activity tolerance;Decreased range of motion;Decreased balance;Decreased mobility;Decreased knowledge of use of DME;Pain;Decreased safety awareness       PT Treatment Interventions DME instruction;Gait training;Stair training;Functional mobility training;Therapeutic activities;Balance training;Therapeutic exercise;Neuromuscular re-education;Patient/family education;Wheelchair mobility training    PT Goals (Current goals can be found in the Care Plan section)  Acute Rehab PT Goals Patient Stated Goal: to improve activity tolerance and reduce falls risk PT Goal Formulation: With patient Time For Goal Achievement: 04/12/24 Potential to Achieve Goals: Fair    Frequency Min 2X/week     Co-evaluation               AM-PAC PT 6 Clicks Mobility  Outcome Measure Help needed turning from your back to your side while in a flat bed without using bedrails?: A Little Help needed moving from lying on your back to sitting on the side of a flat bed without using bedrails?: A Little Help needed moving to and from a bed to a chair (including a wheelchair)?: A Little Help needed standing up from a chair using your arms (e.g., wheelchair or bedside chair)?: A Little Help needed to walk in hospital room?: A Little Help needed climbing 3-5 steps with a railing? : Total 6 Click Score: 16    End of Session Equipment Utilized During Treatment: Gait belt Activity Tolerance: Patient limited by pain Patient left: in bed;with call bell/phone within reach;with family/visitor present Nurse Communication:  Mobility status PT Visit Diagnosis: Other abnormalities of gait and mobility (R26.89);Muscle weakness (generalized) (M62.81);Pain Pain - part of body: Ankle and joints of foot;Knee    Time: 8445-8380 PT Time Calculation (min) (ACUTE ONLY): 25 min   Charges:   PT Evaluation $PT Eval Low Complexity: 1 Low   PT General Charges $$ ACUTE PT VISIT: 1 Visit         Bernardino JINNY Ruth, PT, DPT Acute Rehabilitation Office [phone]   Bernardino JINNY Ruth 03/29/2024, 5:39 PM

## 2024-03-29 NOTE — ED Provider Notes (Addendum)
 " [place] EMERGENCY DEPARTMENT AT [facility] Provider Note   CSN: [id number] Arrival date & time: 03/29/24  1044     Patient presents with: Fall   Rayen A Madewell is a 78 y.o. female.   HPI 78 year old female presents today complaining of fall.  She states that she has chronic pain in both of her knees.  Last night her knees gave out she fell backwards.  She states that she hurts all over.  She states that she has chronic knee pain she does not feel like it is different.  She did not lose consciousness.  She felt like this was a mechanical fall.  However, when she was on the ground she was unable to get up on her own.    Prior to Admission medications  Medication Sig Start Date End Date Taking? Authorizing Provider  amoxicillin -clavulanate (AUGMENTIN ) 875-125 MG per tablet Take 1 tablet by mouth 2 (two) times daily. One po bid x 7 days Patient not taking: Reported on 01/10/2016 06/02/12   Amon Oven, PA-C  atenolol  (TENORMIN ) 100 MG tablet Take 1 tablet (100 mg total) by mouth daily. Patient not taking: Reported on 01/10/2016 06/02/12   Paz, Lisette, PA-C  Azilsartan Medoxomil (EDARBI) 40 MG TABS Take 40 mg by mouth daily.    [provider]  Azilsartan-Chlorthalidone  (EDARBYCLOR) 40-25 MG TABS Take 1 tablet by mouth daily.    [provider]  cloNIDine (CATAPRES) 0.2 MG tablet Take 0.2 mg by mouth daily.    [provider]  cyclobenzaprine  (FLEXERIL ) 5 MG tablet Take 1 tablet (5 mg total) by mouth at bedtime as needed for muscle spasms. DO NOT DRINK ALCOHOL OR DRIVE WHILE TAKING THIS MEDICATION - MAY CAUSE DROWSINESS 12/07/20   Stuart Vernell Norris, PA-C  diclofenac  Sodium (VOLTAREN ) 1 % GEL Apply 4 g topically 4 (four) times daily. 11/30/20   Stuart Vernell Norris, PA-C  furosemide  (LASIX ) 40 MG tablet Take 40 mg by mouth daily.    [provider]  metoprolol  tartrate (LOPRESSOR ) 100 MG tablet Take 100 mg by mouth daily.    [provider]  metoprolol  tartrate (LOPRESSOR ) 25 MG tablet Take 75 mg by mouth every 12 (twelve) hours.  12/24/15   [provider]  potassium chloride  SA (K-DUR,KLOR-CON ) 20 MEQ tablet Take 20 mEq by mouth daily.    [provider]  pravastatin (PRAVACHOL) 20 MG tablet Take 20 mg by mouth daily.    [provider]    Allergies: Patient has no known allergies.    Review of Systems  Updated Vital Signs BP (!) 165/54   Pulse 86   Temp 98.1 F (36.7 C) (Oral)   Resp 16   SpO2 100%   Physical Exam Vitals reviewed.  Constitutional:      Appearance: Normal appearance.  HENT:     Head: Normocephalic.     Right Ear: External ear normal.     Left Ear: External ear normal.     Nose: Nose normal.     Mouth/Throat:     Pharynx: Oropharynx is clear.  Eyes:     Extraocular Movements: Extraocular movements intact.     Pupils: Pupils are equal, round, and reactive to light.  Cardiovascular:     Rate and Rhythm: Normal rate and regular rhythm.     Comments: Radial pulses equal bilaterally Patient with nonpalpable DP and the right foot but PT is palpable DP was also not able to be dopplered We are able  to Doppler pulses in her left foot. Pulmonary:     Effort: Pulmonary effort is normal.     Breath sounds: Normal breath sounds.  Abdominal:     General: Abdomen is flat.     Palpations: Abdomen is soft.  Musculoskeletal:        General: Normal range of motion.     Cervical back: Normal range of motion. No tenderness.     Comments: Patient with some pain initially with palpation of both of her hips but able to fully range them and states that this is baseline pain for her She has wraps placed on her knees and states she has chronic pain in her knees but no acute abnormalities noted  Neurological:     Mental Status: She is alert.     (all labs ordered are listed, but only abnormal results are displayed) Labs Reviewed  CBC  BASIC METABOLIC PANEL WITH GFR   PROTIME-INR    EKG: None  Radiology: No results found.   .Critical Care  Performed by: Levander Houston, MD Authorized by: Levander Houston, MD   Critical care provider statement:    Critical care time (minutes):  60   Critical care was necessary to treat or prevent imminent or life-threatening deterioration of the following conditions:  Circulatory failure   Critical care was time spent personally by me on the following activities:  Development of treatment plan with patient or surrogate, discussions with consultants, evaluation of patient's response to treatment, examination of patient, ordering and review of laboratory studies, ordering and review of radiographic studies, ordering and performing treatments and interventions, pulse oximetry, re-evaluation of patient's condition and review of old charts    Medications Ordered in the ED  sodium chloride  0.9 % bolus 500 mL (has no administration in time range)  acetaminophen  (TYLENOL ) tablet 650 mg (650 mg Oral Given 03/29/24 1155)    Clinical Course as of 03/29/24 1606  Tue Mar 29, 2024  1522 Fall last night, pale painful left foot, fu outpatient  [LS]    Clinical Course User Index [LS] Rogelia Jerilynn RAMAN, MD                                 Medical Decision Making Amount and/or Complexity of Data Reviewed Labs: ordered. Radiology: ordered.  Risk OTC drugs.   78 year old female history of hypertension, hyperlipidemia, smoker, presents today after fall.  She reported initially that she fell backwards and did not think that she had any new injuries.  However on her exam her right great toe was very painful and pale. She is evaluated by vascular surgery for her right foot.  Vascular surgery will arrange outpatient follow-up for this. Her friend was not in the back on my initial evaluation.  He states that her back pain is worsening and that she is unable to get around appropriately. TOC evaluation, PT, initiated. Patient  reevaluated with some tenderness in mid thoracic spine and possibly some increased pain in her lower back.  CT of the thoracic and lumbar spine added. Care discussed with Dr. Rogelia who assumes care will disposition as appropriate     Final diagnoses:  Fall, initial encounter  Claudication    ED Discharge Orders     None          Levander Houston, MD 03/29/24 1606    Levander Houston, MD 03/29/24 1617  "

## 2024-03-29 NOTE — ED Triage Notes (Signed)
 Pt arrives via [facility]. Pt reports her knees often give out, she reports she fell last night because her knees buckled. Pt states she fell backwards. Denies hitting her head, reports some pain in right side of neck/shoulder. No cervical tenderness. Pt denies any other symptoms or injuries. She is AxOx4. No blood thinners and no LOC.

## 2024-03-29 NOTE — ED Provider Notes (Signed)
" °  [place] EMERGENCY DEPARTMENT AT [facility] Provider Assume Care Note I assumed care of Andrea Meyer on 03/29/2024 at 3 PM from Dr. Levander.   Briefly, Andrea Meyer is a 78 y.o. female who: PMHx: HTN, HLD P/w fall last night, prolonged downtime Has baseline diffuse pain, back pain is worse than baseline Also has pale painful left foot, evaluated by vascular surgery who will continue to follow-up patient outpatient in clinic  Plan at the time of handoff: Follow-up labs, imaging, trial of ambulation   Please refer to the original providers note for additional information regarding the care of Andrea Meyer.  Reassessment: I personally reassessed the patient: Patient without any acute complaints or additional questions.  Vital Signs:  ED Triage Vitals  Encounter Vitals Group     BP 03/29/24 1047 (!) 179/88     Girls Systolic BP Percentile --      Girls Diastolic BP Percentile --      Boys Systolic BP Percentile --      Boys Diastolic BP Percentile --      Pulse Rate 03/29/24 1047 90     Resp 03/29/24 1047 17     Temp 03/29/24 1047 97.8 F (36.6 C)     Temp Source 03/29/24 1454 Oral     SpO2 03/29/24 1044 99 %     Weight --      Height --      Head Circumference --      Peak Flow --      Pain Score 03/29/24 1048 8     Pain Loc --      Pain Education --      Exclude from Growth Chart --      Hemodynamics:  The patient is hemodynamically stable. Mental Status:  The patient is alert  Additional MDM: CTs do not show acute traumatic abnormalities.  CBC with slight anemia, patient without any recent symptoms of bleeding.  No findings on exam concerning for bleeding.  BMP does show significant elevation of creatinine as well as symmetric elevation of BUN.  Patient given fluids without significant change in creatinine on reevaluation.  Patient overall poor historian, patient only has not had labs performed in 5 years prior to this, therefore difficult to say  whether or not this is acute or chronic.  Spoke with Dr. Rayburn to further discuss whether or not this is more likely to be acute or chronic, he recommended renal ultrasound to evaluate for atrophy and echogenicity, which would be indicators of chronic kidney disease rather than AKI, additionally anemia is potentially consistent with anemia of chronic disease related to CKD.  Ultrasound was obtained which demonstrates atrophy particularly of the right kidney as well as increased echogenicity consistent with medical renal disease, therefore suspect that patient likely has CKD.  Thus do not feel that patient requires admission for AKI at this time.  Patient placed into TOC boarder status.  Referrals to nephrology as well as vascular surgery placed in dispo tab for 1 patient is ultimately discharged.  Disposition: Placed in TOC boarder status   FREDRIK CANDIE Later, MD Emergency Medicine    Later Andrea RAMAN, MD 03/29/24 2348  "

## 2024-03-29 NOTE — Consult Note (Addendum)
 " Hospital Consult    Reason for Consult:  bilateral great toe pain (R>L) Requesting Physician:  Levander MRN #:  [id number]  History of Present Illness: This is a 78 y.o. female who presented to the hospital after she had a fall.  Her best friend is at bedside and he has concerns that she does not walk and is not safe to be home.  He wants to speak with a child psychotherapist.    Pt states that her left great toe started hurting and then that improved and the right great toe started hurting and now they are both painful and the right is worse than the left.  She states she has pain in her feet when the weather is bad or around 5pm.  She states she takes some arthritis tylenol  with a little relief.  She has a rub that she puts on her feet that also helps.   She states that when she does walk, she gets occasional cramping but not all the time.  She does smoke about 5 cigarettes per day.   The pt is on a statin for cholesterol management.  The pt is not on a daily aspirin.   Other AC:  none The pt is on BB, clonidine, diuretic for hypertension.   The pt is not on medication for diabetes PTA. Tobacco hx:  current  Past Medical History:  Diagnosis Date   Hyperlipidemia    Hypertension     Past Surgical History:  Procedure Laterality Date   BREAST SURGERY     tumor removal    Allergies[1]  Prior to Admission medications  Medication Sig Start Date End Date Taking? Authorizing Provider  amoxicillin -clavulanate (AUGMENTIN ) 875-125 MG per tablet Take 1 tablet by mouth 2 (two) times daily. One po bid x 7 days Patient not taking: Reported on 01/10/2016 06/02/12   Paz, Lisette, PA-C  atenolol  (TENORMIN ) 100 MG tablet Take 1 tablet (100 mg total) by mouth daily. Patient not taking: Reported on 01/10/2016 06/02/12   Paz, Lisette, PA-C  Azilsartan Medoxomil (EDARBI) 40 MG TABS Take 40 mg by mouth daily.    [provider]  Azilsartan-Chlorthalidone  (EDARBYCLOR) 40-25 MG TABS Take 1 tablet by mouth  daily.    [provider]  cloNIDine (CATAPRES) 0.2 MG tablet Take 0.2 mg by mouth daily.    [provider]  cyclobenzaprine  (FLEXERIL ) 5 MG tablet Take 1 tablet (5 mg total) by mouth at bedtime as needed for muscle spasms. DO NOT DRINK ALCOHOL OR DRIVE WHILE TAKING THIS MEDICATION - MAY CAUSE DROWSINESS 12/07/20   Stuart Vernell Norris, PA-C  diclofenac  Sodium (VOLTAREN ) 1 % GEL Apply 4 g topically 4 (four) times daily. 11/30/20   Stuart Vernell Norris, PA-C  furosemide  (LASIX ) 40 MG tablet Take 40 mg by mouth daily.    [provider]  metoprolol  tartrate (LOPRESSOR ) 100 MG tablet Take 100 mg by mouth daily.    [provider]  metoprolol  tartrate (LOPRESSOR ) 25 MG tablet Take 75 mg by mouth every 12 (twelve) hours.  12/24/15   [provider]  potassium chloride  SA (K-DUR,KLOR-CON ) 20 MEQ tablet Take 20 mEq by mouth daily.    [provider]  pravastatin (PRAVACHOL) 20 MG tablet Take 20 mg by mouth daily.    [provider]    Social History   Socioeconomic History   Marital status: Divorced    Spouse name: Not on file   Number of children: Not on file   Years of  education: Not on file   Highest education level: Not on file  Occupational History   Not on file  Tobacco Use   Smoking status: Every Day    Current packs/day: 0.25    Types: Cigarettes   Smokeless tobacco: Never  Substance and Sexual Activity   Alcohol use: No   Drug use: No   Sexual activity: Not on file  Other Topics Concern   Not on file  Social History Narrative   Not on file   Social Drivers of Health   Tobacco Use: High Risk (03/29/2024)   Patient History    Smoking Tobacco Use: Every Day    Smokeless Tobacco Use: Never    Passive Exposure: Not on file  Financial Resource Strain: Not on file  Food Insecurity: Not on file  Transportation Needs: Not on file  Physical Activity: Not on file  Stress: Not on file  Social Connections: Not on file   Intimate Partner Violence: Not on file  Depression (PHQ2-9): Not on file  Alcohol Screen: Not on file  Housing: Not on file  Utilities: Not on file  Health Literacy: Not on file     ROS: [x]  Positive   [ ]  Negative   [ ]  All sytems reviewed and are negative  Cardiac: [x]  HTN [x]  HLD   Vascular: [x]  pain in legs while walking [x]  pain in legs at rest [x]  pain in legs at night []  non-healing ulcers []  hx of DVT []  swelling in legs  Pulmonary: []  asthma/wheezing []  home O2  Neurologic: []  hx of CVA []  mini stroke   Hematologic: []  hx of cancer  Endocrine:   []  diabetes []  thyroid  disease  GI []  GERD  GU: []  CKD/renal failure []  HD--[]  M/W/F or []  T/T/S  Psychiatric: []  anxiety []  depression  Musculoskeletal: [x]  arthritis []  joint pain  Integumentary: []  rashes []  ulcers  Constitutional: []  fever  []  chills  Physical Examination  Vitals:   03/29/24 1044 03/29/24 1047  BP:  (!) 179/88  Pulse:  90  Resp:  17  Temp:  97.8 F (36.6 C)  SpO2: 99% 96%   There is no height or weight on file to calculate BMI.  General:  WDWN in NAD Gait: Not observed HENT: WNL, normocephalic Pulmonary: normal non-labored breathing Cardiac: regular Skin: without rashes Vascular Exam/Pulses: + right peroneal and PT doppler signal + left DP/PT doppler signal Extremities: small dry ulceration right great toe tip; bilateral feet are dry Musculoskeletal: no muscle wasting or atrophy  Neurologic: A&O X 3 Psychiatric:  The pt has Normal affect.   CBC    Component Value Date/Time   WBC 7.5 06/01/2018 0844   RBC 4.76 06/01/2018 0844   HGB 12.5 06/01/2018 0844   HCT 42.4 06/01/2018 0844   PLT 242 06/01/2018 0844   MCV 89.1 06/01/2018 0844   MCH 26.3 06/01/2018 0844   MCHC 29.5 (L) 06/01/2018 0844   RDW 16.8 (H) 06/01/2018 0844   LYMPHSABS 2.3 06/01/2018 0844   MONOABS 1.0 06/01/2018 0844   EOSABS 0.1 06/01/2018 0844   BASOSABS 0.0 06/01/2018 0844     BMET    Component Value Date/Time   NA 135 06/01/2018 0844   K 4.4 06/01/2018 0844   CL 106 06/01/2018 0844   CO2 19 (L) 06/01/2018 0844   GLUCOSE 90 06/01/2018 0844   BUN 19 06/01/2018 0844   CREATININE 1.37 (H) 06/01/2018 0844   CALCIUM 9.0 06/01/2018 0844   GFRNONAA 38 (L) 06/01/2018 9155  GFRAA 44 (L) 06/01/2018 0844      ASSESSMENT/PLAN: This is a 78 y.o. female came to ED after a fall with c/o bilateral great toe pain.    Bilateral great toe pain -from vascular perspective, pt has doppler flow in the right peroneal and PT arteries and left PT and DP arteries.  She has a very small dry ulceration on the tip of the right great toe.  Pt seen with Dr. Pearline and feel that she can follow up in the office for further discussions about arteriogram.  He explained to her that this may help her toe pain but will not make her legs stronger.  Our office will arrange appt.  Will get ABI at that appointment -continue statin, would benefit from aspirin if not contraindications  Current smoker --it was discussed with pt the importance of smoking cessation.   Lucie Apt, PA-C [facility] [phone]  VASCULAR STAFF ADDENDUM: I have independently interviewed and examined the patient. I agree with the above.  Presented after a fall with shoulder pain and also described nightly toe pain R > L. No significant wounds or signs of infection. Biphasic PT and peroneal on right and DP/PT on left.  Current smoker, not interested in quitting. Will plain for outpatient follow up in a few weeks to discuss her symptoms and obtain a formal ABI at that time.   Norman GORMAN Pearline MD [facility] Phone Number: [phone] 03/29/2024 3:50 PM      [1] No Known Allergies  "

## 2024-03-30 DIAGNOSIS — I739 Peripheral vascular disease, unspecified: Secondary | ICD-10-CM | POA: Diagnosis not present

## 2024-03-30 MED ORDER — FUROSEMIDE 20 MG PO TABS: 40.0000 mg | ORAL_TABLET | Freq: Every day | ORAL | Status: DC

## 2024-03-30 MED ORDER — SODIUM BICARBONATE 650 MG PO TABS
1300.0000 mg | ORAL_TABLET | Freq: Two times a day (BID) | ORAL | Status: DC
  Administered 2024-03-30: 1300 mg via ORAL
  Filled 2024-03-30: qty 2

## 2024-03-30 MED ORDER — METOPROLOL TARTRATE 25 MG PO TABS
100.0000 mg | ORAL_TABLET | Freq: Every day | ORAL | Status: DC
  Administered 2024-03-30: 100 mg via ORAL
  Filled 2024-03-30: qty 4

## 2024-03-30 MED ORDER — POTASSIUM CHLORIDE CRYS ER 20 MEQ PO TBCR: 10.0000 meq | EXTENDED_RELEASE_TABLET | Freq: Every day | ORAL | Status: DC

## 2024-03-30 MED ORDER — ACETAMINOPHEN 500 MG PO TABS
1000.0000 mg | ORAL_TABLET | Freq: Once | ORAL | Status: AC
  Administered 2024-03-30: 1000 mg via ORAL
  Filled 2024-03-30: qty 2

## 2024-03-30 MED ORDER — SODIUM BICARBONATE 650 MG PO TABS: 1300.0000 mg | ORAL_TABLET | Freq: Two times a day (BID) | ORAL | 0 refills | Status: AC

## 2024-03-30 MED ORDER — HYDRALAZINE HCL 25 MG PO TABS: 25.0000 mg | ORAL_TABLET | Freq: Three times a day (TID) | ORAL | 0 refills | Status: AC

## 2024-03-30 MED ORDER — CHLORTHALIDONE 25 MG PO TABS
25.0000 mg | ORAL_TABLET | Freq: Every day | ORAL | Status: DC
  Administered 2024-03-30: 25 mg via ORAL
  Filled 2024-03-30: qty 1

## 2024-03-30 MED ORDER — AMLODIPINE BESYLATE 5 MG PO TABS
10.0000 mg | ORAL_TABLET | Freq: Every day | ORAL | Status: DC
  Administered 2024-03-30: 10 mg via ORAL
  Filled 2024-03-30: qty 2

## 2024-03-30 MED ORDER — SODIUM BICARBONATE 650 MG PO TABS: 1300.0000 mg | ORAL_TABLET | Freq: Three times a day (TID) | ORAL | Status: DC

## 2024-03-30 NOTE — ED Notes (Signed)
 Report to Grayce at [facility].

## 2024-03-30 NOTE — Consult Note (Signed)
 Nephrology Consult   Requesting provider: Sid Boning Service requesting consult: ER Reason for consult: CKD 4   Assessment/Recommendations: Andrea Meyer is a/an 78 y.o. female with a past medical history HTN, HLD, tobacco use disorder who present w/ Fall   CKD 4: Most likely we are seeing CKD 4.  Unlikely she has an AKI but hard to prove definitively given lack of laboratory data.  No acute concerns regarding the kidneys.  I do not think she needs an hospital stay because of her numbers.  We will perform a basic evaluation as below but she is okay to discharge from our perspective -Obtain urinalysis and parathyroid hormone -Repeat labs daily if she remains in the hospital -If she discharges would repeat labs on Friday to recheck bicarb as below -If she is discharged to an area around here would arrange follow-up in our office -Hold Lasix  given no obvious signs of volume excess -Stop K supplementation -Continue to monitor daily Cr, Dose meds for GFR -Monitor Daily I/Os, Daily weight  -Maintain MAP>65 for optimal renal perfusion.  -Avoid nephrotoxic medications including NSAIDs -Use synthetic opioids (Fentanyl/Dilaudid) if needed  Essential hypertension: Agree with continuing home chlorthalidone , metoprolol , and amlodipine .  Can titrate medications as needed.  Likely blood pressure not well-controlled at baseline  Anemia: CKD likely contributing.  Obtain iron studies.  Metabolic acidosis: Likely associated with CKD.  Start sodium bicarbonate  1300 mg twice daily.  Decrease dose and stop if needed.  Goal bicarbonate 18-22.  If she discharges can recheck BMP on Friday  Dispo: Difficult home life.  Needs SNF.  Defer to primary team   Recommendations conveyed to primary service.    [facility] 03/30/2024 8:34 AM   _____________________________________________________________________________________ CC: Fall  History of Present Illness: Andrea Meyer is a/an 78 y.o. female with a past medical history of past medical history HTN, HLD, tobacco use disorder who present w/ Fall   Patient presented to the hospital yesterday after a fall.  Friends note that the patient has had more falls recently.  She has poor access to resources in the community and lack of support at home.  She does receive support from friends.  Yesterday she had a fall where she went backwards.  Unclear if she had any obvious injuries but her right great toe appeared to be abnormal.  She was evaluated by vascular surgery who felt follow-up in the outpatient setting was appropriate.  There is concern about the patient's ability to be at home and take care of herself.  Staff in the hospital are evaluating for appropriate disposition likely to skilled nursing facility.  Nephrology was asked to evaluate for elevated creatinine.  Most recent labs we have are from February 2020 when creatinine was 1.4.  Patient states she follows with Dr. Zachary Civatte.  I attempted to call their office for more recent labs but was unable to get in touch with them because of the holiday.  She states she cannot remember if she has kidney disease or not.  But says probably so.  I asked her if she had seen a kidney doctor before and she said maybe.  She denies fevers, chills, shortness of breath, chest pain, nausea, vomiting, diarrhea.  Appetite has been okay.  She says she needs to go home to work on some things.  In the emergency department creatinine was 3.3.  Bicarb was 13.  Renal ultrasound demonstrated atrophic right kidney and increased echogenicity of both kidneys but no  acute concern.   Medications:  Current Facility-Administered Medications  Medication Dose Route Frequency Provider Last Rate Last Admin   amLODipine  (NORVASC ) tablet 10 mg  10 mg Oral Daily Beverley Leita LABOR, PA-C       chlorthalidone  (HYGROTON ) tablet 25 mg  25 mg Oral Daily Murphy, Laura A, PA-C   25 mg at 03/30/24 9665    furosemide  (LASIX ) tablet 40 mg  40 mg Oral Daily Murphy, Laura A, PA-C       metoprolol  tartrate (LOPRESSOR ) tablet 100 mg  100 mg Oral Daily Murphy, Laura A, PA-C       potassium chloride  SA (KLOR-CON  M) CR tablet 10 mEq  10 mEq Oral Daily Murphy, Laura A, PA-C       sodium bicarbonate  tablet 1,300 mg  1,300 mg Oral BID Kenadi Miltner J, MD       Current Outpatient Medications  Medication Sig Dispense Refill   acetaminophen  (TYLENOL ) 500 MG tablet Take 500-1,000 mg by mouth every 6 (six) hours as needed for moderate pain (pain score 4-6).     amLODipine  (NORVASC ) 10 MG tablet Take 10 mg by mouth daily.     Azilsartan Medoxomil (EDARBI) 40 MG TABS Take 40 mg by mouth daily.     chlorthalidone  (HYGROTON ) 25 MG tablet Take 25 mg by mouth daily.     cloNIDine (CATAPRES) 0.2 MG tablet Take 0.2 mg by mouth daily.     cyclobenzaprine  (FLEXERIL ) 5 MG tablet Take 1 tablet (5 mg total) by mouth at bedtime as needed for muscle spasms. DO NOT DRINK ALCOHOL OR DRIVE WHILE TAKING THIS MEDICATION - MAY CAUSE DROWSINESS 15 tablet 0   diclofenac  Sodium (VOLTAREN ) 1 % GEL Apply 4 g topically 4 (four) times daily. 150 g 2   furosemide  (LASIX ) 40 MG tablet Take 40 mg by mouth daily.     metoprolol  tartrate (LOPRESSOR ) 100 MG tablet Take 100 mg by mouth daily.     Potassium Chloride  ER 20 MEQ TBCR      pravastatin (PRAVACHOL) 20 MG tablet Take 20 mg by mouth daily.       ALLERGIES Patient has no known allergies.  MEDICAL HISTORY Past Medical History:  Diagnosis Date   Hyperlipidemia    Hypertension      SOCIAL HISTORY Social History   Socioeconomic History   Marital status: Divorced    Spouse name: Not on file   Number of children: Not on file   Years of education: Not on file   Highest education level: Not on file  Occupational History   Not on file  Tobacco Use   Smoking status: Every Day    Current packs/day: 0.25    Types: Cigarettes   Smokeless tobacco: Never  Substance and Sexual  Activity   Alcohol use: No   Drug use: No   Sexual activity: Not on file  Other Topics Concern   Not on file  Social History Narrative   Not on file   Social Drivers of Health   Tobacco Use: High Risk (03/29/2024)   Patient History    Smoking Tobacco Use: Every Day    Smokeless Tobacco Use: Never    Passive Exposure: Not on file  Financial Resource Strain: Not on file  Food Insecurity: Not on file  Transportation Needs: Not on file  Physical Activity: Not on file  Stress: Not on file  Social Connections: Not on file  Intimate Partner Violence: Not on file  Depression (EYV7-0): Not on file  Alcohol Screen: Not on file  Housing: Not on file  Utilities: Not on file  Health Literacy: Not on file     FAMILY HISTORY History reviewed. No pertinent family history.    Review of Systems: 12 systems reviewed Otherwise as per HPI, all other systems reviewed and negative  Physical Exam: Vitals:   03/30/24 0234 03/30/24 0447  BP: (!) 204/76 (!) 177/77  Pulse: (!) 108   Resp: 16   Temp: 98.6 F (37 C)   SpO2: 98%    No intake/output data recorded. No intake or output data in the 24 hours ending 03/30/24 0834 General: well-appearing, no acute distress HEENT: anicteric sclera, poor dentition, mmm CV: tachycardic, no rub Lungs: clear to auscultation bilaterally, normal work of breathing Abd: soft, non-tender, non-distended Skin: ulceration on right toe, dry feet, otherwise no visible lesions or rashes Psych: alert, engaged, appropriate mood and affect Musculoskeletal: no obvious deformities Neuro: normal speech, no gross focal deficits   Test Results Reviewed Lab Results  Component Value Date   NA 139 03/29/2024   K 4.8 03/29/2024   CL 114 (H) 03/29/2024   CO2 13 (L) 03/29/2024   BUN 38 (H) 03/29/2024   CREATININE 3.14 (H) 03/29/2024   CALCIUM 8.4 (L) 03/29/2024   ALBUMIN 3.8 06/02/2012    CBC Recent Labs  Lab 03/29/24 1724  WBC 8.3  HGB 10.2*  HCT 32.5*   MCV 99.4  PLT 219    I have reviewed all relevant outside healthcare records related to the patient's current hospitalization

## 2024-03-30 NOTE — Discharge Instructions (Addendum)
 Thank you for coming to [facility] Emergency Department. You were seen for fall.   Please follow up with vascular surgery and with nephrology within 1-2 weeks. Referrals have been placed. If you do not hear from them, you can call their offices.  -Please start sodium bicarbonate  1300 mg twice per day.  -Please repeat labs on Friday to check bicarb per nephrology recommendations. Goal bicarb is 18-22. Can decrease or stop the bicarb if needed. -Please avoid NSAIDs and other nephrotoxic medications -Please continue to take your blood pressure medications for your high blood pressure.   Please follow up with your doctors within 1-2 weeks. Please have labs checked on Friday.   Do not hesitate to return to the ED or call 911 if you experience: -Worsening symptoms -Nausea vomiting or inability to eat/drink -Lightheadedness, passing out -Fevers/chills -Anything else that concerns you

## 2024-03-30 NOTE — Progress Notes (Signed)
 Patient discharged late afternoon from ED to SNF. CSW received call from admissions coordinator at SNF stating patient's med list on AVS appeared unreconciled and that they would return patient to ED if not rectified immediately d/t holiday pharmacy closures. CSW collaborated with pharmacy and attending to provide SNF with accurate med list.

## 2024-03-30 NOTE — ED Provider Notes (Signed)
 Emergency Medicine Observation Re-evaluation Note  Andrea Meyer is a 78 y.o. female, seen on rounds today.  Pt initially presented to the ED for complaints of Fall Currently, the patient is resting. For more information, please see notes from 03/29/24.   Physical Exam  BP (!) 177/77 (BP Location: Left Arm)   Pulse (!) 108   Temp 98.6 F (37 C) (Oral)   Resp 16   SpO2 98%  Physical Exam General: resting  Cardiac: well-perfused Lungs: no resp distress Psych: calm  ED Course / MDM  EKG:EKG Interpretation Date/Time:  Tuesday March 29 2024 13:05:24 EST Ventricular Rate:  83 PR Interval:  134 QRS Duration:  70 QT Interval:  342 QTC Calculation: 401 R Axis:   35  Text Interpretation: Normal sinus rhythm Possible Anterior infarct , age undetermined Abnormal ECG When compared with ECG of 01-Jun-2018 07:57, PREVIOUS ECG IS PRESENT Confirmed by Rogelia Satterfield ([id number]) on 03/29/2024 5:15:39 PM  I have reviewed the labs performed to date as well as medications administered while in observation.  Recent changes in the last 24 hours include: PT evaluated and recommended inpatient f/u therapy <3 h/day. Manual wheelchair recommended if Pt elects discharge. Her pastor is her main advocate; she lives alone and they report a decline in her functional status. Recommended SNF.  Plan  Current plan is for SNF placement.    Franklyn Sid SAILOR, MD 03/30/24 [phone]

## 2024-03-30 NOTE — Progress Notes (Addendum)
 CSW presented bed offers with [facility]; bed choice is [facility]. Insurance authorization initiated and approved. CSW contacted facility regarding bed availability; facility requested AVS as soon as possible in an effort to accommodate holiday dc. MD notified via secure chat. Awaiting AVS and coordinate dc with facility. CSW to follow.  Update- AVS completed and sent to facility. Call report [phone], Room 1005 given to RN.

## 2024-03-30 NOTE — ED Notes (Addendum)
 Pt ambulated to bedside commode with this NT and RN. Pt needed maximum assistance to bedside commode. This NT offered for pt to use a bedpan, but pt refused, stating it would make her bed dirty. Pt also expressed frustration that she needed help, stating she is used to doing everything on her own and having a morning routine. Pt requested tooth brush and toothpaste, this NT placed toothbrush and toothpaste bedside for the pt. This NT provided comfort measures, linen change. the pt is resting and comfortable at this time. Pt denies any needs at this time.

## 2024-04-04 ENCOUNTER — Other Ambulatory Visit: Payer: Self-pay | Admitting: *Deleted

## 2024-04-04 DIAGNOSIS — M79606 Pain in leg, unspecified: Secondary | ICD-10-CM

## 2024-04-14 NOTE — Progress Notes (Unsigned)
" ° °  Patient ID: Andrea Meyer, female   DOB: May 18, 1945, 79 y.o.   MRN: [id number]  Reason for Consult: No chief complaint on file.   Referred by Hillman Bare, MD  Subjective:     HPI Andrea Meyer is a 79 y.o. female who presented to the ED a few weeks ago after a fall and was noted to have R > L foot pain at that time. Today she reports ***  Past Medical History:  Diagnosis Date   Hyperlipidemia    Hypertension    No family history on file. Past Surgical History:  Procedure Laterality Date   BREAST SURGERY     tumor removal    Short Social History:  Social History   Tobacco Use   Smoking status: Every Day    Current packs/day: 0.25    Types: Cigarettes   Smokeless tobacco: Never  Substance Use Topics   Alcohol use: No    Allergies[1]  Current Outpatient Medications  Medication Sig Dispense Refill   acetaminophen  (TYLENOL ) 500 MG tablet Take 500-1,000 mg by mouth every 6 (six) hours as needed for moderate pain (pain score 4-6).     amLODipine  (NORVASC ) 10 MG tablet Take 10 mg by mouth daily.     diclofenac  Sodium (VOLTAREN ) 1 % GEL Apply 4 g topically 4 (four) times daily. 150 g 2   hydrALAZINE  (APRESOLINE ) 25 MG tablet Take 1 tablet (25 mg total) by mouth 3 (three) times daily. 90 tablet 0   metoprolol  tartrate (LOPRESSOR ) 100 MG tablet Take 100 mg by mouth daily.     pravastatin (PRAVACHOL) 20 MG tablet Take 20 mg by mouth daily. (Patient not taking: Reported on 03/30/2024)     sodium bicarbonate  650 MG tablet Take 2 tablets (1,300 mg total) by mouth 2 (two) times daily. 120 tablet 0   No current facility-administered medications for this visit.    REVIEW OF SYSTEMS  All other systems were reviewed and are negative     Objective:  Objective   There were no vitals filed for this visit. There is no height or weight on file to calculate BMI.  Physical Exam General: no acute distress Cardiac: hemodynamically stable Abdomen: non-tender, no  pulsatile mass*** Extremities: no edema, cyanosis or wounds*** Vascular:   Right: ***  Left: ***  Data: ABI ***     Assessment/Plan:   Andrea Meyer is a 79 y.o. female with ***  Recommendations to optimize cardiovascular risk: Abstinence from all tobacco products. Blood glucose control with goal A1c < 7%. Blood pressure control with goal blood pressure < 140/90 mmHg. Lipid reduction therapy with goal LDL-C <55 mg/dL  Aspirin 81mg  PO QD.  Atorvastatin 40-80mg  PO QD (or other high intensity statin therapy).   Andrea GORMAN Serve MD [facility] [place]     [1] No Known Allergies  "

## 2024-04-15 ENCOUNTER — Ambulatory Visit (HOSPITAL_COMMUNITY): Admitting: Vascular Surgery

## 2024-04-15 ENCOUNTER — Telehealth: Payer: Self-pay

## 2024-04-15 ENCOUNTER — Ambulatory Visit (HOSPITAL_COMMUNITY)
Admission: RE | Admit: 2024-04-15 | Discharge: 2024-04-15 | Disposition: A | Discharge status: Home / Self Care | Source: Ambulatory Visit | Attending: Vascular Surgery | Admitting: Vascular Surgery

## 2024-04-15 DIAGNOSIS — M79606 Pain in leg, unspecified: Secondary | ICD-10-CM

## 2024-04-15 NOTE — Telephone Encounter (Signed)
 Pt arrived from [facility] with driver. This PAA asked driver from SNF if she was able to transfer onto the bed. Driver said yes but pt said no, so the driver calls the facility to ask how pts mobility was. Driver states that Mr. Arloa, who is her emergency contact (pt says it's her pastor) was coming. This PAA called Mr. Arloa, but did not answer (MP didn't feel that Mr. Arloa would be able or feel comfortable to transfer pt anyway) SNF states that pt uses the [facility] assist. MP goes to US  tech (#4) to make sure it's okay to proceed with check in. US  states that as long as pt is able to lift herself up will holding onto the assist then she could be scanned. MP explained this to pt/driver. Driver was quick to say she can but again, pt says she cannot. Ms. Tortorelli stated she felt out of the loop on what was going on and decisions were being made for her. This PAA told Ms. Mells that I didn't want to make any decisions that she wasn't okay with, but it sounded like she needed more assistance. Pt agreed, and was okay to r/s. Pt states that her knees are in pain and she needed someone to lift her since she had been sitting for so long, and she also needed to go to the bathroom, and someone was going to need to assist her with that and wipe her. This PAA made the decision that pt needed to be r/s after pt made this statement. Appt was r/s for 05/13/24 @ 8:30/9:40. After pt left MP called facility and spoke with the woman who schedules appts,Denise, and explained pt needed to be with a CNA for the duration of her appt. Denise asked if pt could come via PTAR, and confirmed that Mr. Arloa wouldn't have been able to help. MP said that was okay and a CNA would need to be available for the visit. Karna understood and said it would not be an issue for the next appt. MP told of new date/time to have US  and see MD.

## 2024-04-25 ENCOUNTER — Other Ambulatory Visit (HOSPITAL_COMMUNITY): Payer: Self-pay | Admitting: Nephrology

## 2024-04-25 ENCOUNTER — Telehealth: Payer: Self-pay

## 2024-04-25 DIAGNOSIS — D631 Anemia in chronic kidney disease: Secondary | ICD-10-CM | POA: Insufficient documentation

## 2024-04-25 NOTE — Telephone Encounter (Signed)
 Dr. Melia, patient will be scheduled as soon as possible.  Auth Submission: NO AUTH NEEDED Site of care: Site of care: [facility] Payer: [employer] medicare Medication & CPT/J Code(s) submitted: Venofer (Iron Sucrose) J1756 Diagnosis Code:  Route of submission (phone, fax, portal):  Phone # Fax # Auth type: Buy/Bill PB Units/visits requested: 300mg  x 2 doses Reference number:  Approval from: 04/25/24 to 09/04/24

## 2024-05-11 NOTE — Progress Notes (Unsigned)
" ° °  Patient ID: Andrea Meyer, female   DOB: 1946/03/04, 79 y.o.   MRN: [id number]  Reason for Consult: No chief complaint on file.   Referred by Hillman Bare, MD  Subjective:     HPI Andrea Meyer is a 79 y.o. female who presented to the ED about a month ago after a fall and was noted to have R > L foot pain at that time. Today she reports ***  Past Medical History:  Diagnosis Date   Hyperlipidemia    Hypertension    No family history on file. Past Surgical History:  Procedure Laterality Date   BREAST SURGERY     tumor removal    Short Social History:  Social History   Tobacco Use   Smoking status: Every Day    Current packs/day: 0.25    Types: Cigarettes   Smokeless tobacco: Never  Substance Use Topics   Alcohol use: No    Allergies[1]  Current Outpatient Medications  Medication Sig Dispense Refill   acetaminophen  (TYLENOL ) 500 MG tablet Take 500-1,000 mg by mouth every 6 (six) hours as needed for moderate pain (pain score 4-6).     amLODipine  (NORVASC ) 10 MG tablet Take 10 mg by mouth daily.     diclofenac  Sodium (VOLTAREN ) 1 % GEL Apply 4 g topically 4 (four) times daily. 150 g 2   hydrALAZINE  (APRESOLINE ) 25 MG tablet Take 1 tablet (25 mg total) by mouth 3 (three) times daily. 90 tablet 0   metoprolol  tartrate (LOPRESSOR ) 100 MG tablet Take 100 mg by mouth daily.     pravastatin (PRAVACHOL) 20 MG tablet Take 20 mg by mouth daily. (Patient not taking: Reported on 03/30/2024)     No current facility-administered medications for this visit.    REVIEW OF SYSTEMS  All other systems were reviewed and are negative     Objective:  Objective   There were no vitals filed for this visit. There is no height or weight on file to calculate BMI.  Physical Exam General: no acute distress Cardiac: hemodynamically stable Abdomen: non-tender, no pulsatile mass*** Extremities: no edema, cyanosis or wounds*** Vascular:   Right: ***  Left:  ***  Data: ABI ***     Assessment/Plan:   Andrea Meyer is a 79 y.o. female with ***  Recommendations to optimize cardiovascular risk: Abstinence from all tobacco products. Blood glucose control with goal A1c < 7%. Blood pressure control with goal blood pressure < 140/90 mmHg. Lipid reduction therapy with goal LDL-C <55 mg/dL  Aspirin 81mg  PO QD.  Atorvastatin 40-80mg  PO QD (or other high intensity statin therapy).   Andrea GORMAN Serve MD [facility]      [1] No Known Allergies  "

## 2024-05-13 ENCOUNTER — Ambulatory Visit (HOSPITAL_COMMUNITY)

## 2024-05-13 ENCOUNTER — Ambulatory Visit (INDEPENDENT_AMBULATORY_CARE_PROVIDER_SITE_OTHER): Admitting: Vascular Surgery

## 2024-05-13 ENCOUNTER — Encounter: Payer: Self-pay | Admitting: Vascular Surgery

## 2024-05-13 ENCOUNTER — Ambulatory Visit (HOSPITAL_COMMUNITY): Admission: RE | Admit: 2024-05-13

## 2024-05-13 VITALS — BP 96/61 | HR 84 | Temp 97.9°F

## 2024-05-13 DIAGNOSIS — M79606 Pain in leg, unspecified: Secondary | ICD-10-CM | POA: Diagnosis not present

## 2024-05-13 DIAGNOSIS — I739 Peripheral vascular disease, unspecified: Secondary | ICD-10-CM

## 2024-05-13 LAB — VAS US ABI WITH/WO TBI
Left ABI: 0.63
Right ABI: 0.47

## 2024-05-20 ENCOUNTER — Ambulatory Visit (HOSPITAL_COMMUNITY)
